# Patient Record
Sex: Female | Born: 1990 | Hispanic: No | Marital: Married | Smoking: Never smoker
Health system: Southern US, Community
[De-identification: ages and names within clinical notes are randomized; demographics above are authoritative.]

---

## 2020-10-21 NOTE — L&D Delivery Note (Addendum)
OB/GYN Faculty Practice Delivery Note  Mary Quinn is a 30 y.o. [id number] s/p Breech VD at [redacted]w[redacted]d. She was admitted for SOL.   ROM: 0h 74m with clear fluid GBS Status:  Positive/-- (04/20 0000) (received >2 doses of amp) Maximum Maternal Temperature: 97.24F  Labor Progress: Initial SVE: 1/90-100/-1. She had spontaneous ROM at 5cm around 0855 and then quickly progressed to complete.    Delivery Date/Time: 06/10/2021 at 0914 Delivery: Called to room and patient was complete. Delivery call made. Known frank breech positioning, Dr. Alysia Quinn present for delivery and delivered infant body and head without difficulty.  No nuchal cord present. Infant without spontaneous cry, placed on mother's abdomen, dried and stimulated. Cord clamped x 2 shortly after delivery and cut by provider, handed off to NICU team. Cord blood drawn. Placenta delivered spontaneously with gentle cord traction. Fundus firm with massage and Pitocin. 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  Baby Weight: pending  Placenta: Sent to pathology  Complications: None Lacerations: None  EBL: 114 mL Analgesia: None   Infant:  APGAR (1 MIN): 7 APGAR (5 MINS): 9  Mary Penna, DO  OB Family Medicine Fellow, [facility] for [employer], [facility] 06/10/2021, 9:47 AM   OB Attending Agree with above documentation. I was present and delivered infant from frank breech presentation without problems.   Nettie Elm, MD

## 2021-01-24 ENCOUNTER — Encounter: Payer: Self-pay | Admitting: General Practice

## 2021-02-07 LAB — OB RESULTS CONSOLE GBS: GBS: POSITIVE

## 2021-02-14 ENCOUNTER — Ambulatory Visit (INDEPENDENT_AMBULATORY_CARE_PROVIDER_SITE_OTHER): Payer: Medicaid Other | Admitting: Family Medicine

## 2021-02-14 ENCOUNTER — Other Ambulatory Visit (HOSPITAL_COMMUNITY)
Admission: RE | Admit: 2021-02-14 | Discharge: 2021-02-14 | Disposition: A | Discharge status: Home / Self Care | Payer: Medicaid Other | Source: Ambulatory Visit | Attending: Family Medicine | Admitting: Family Medicine

## 2021-02-14 ENCOUNTER — Other Ambulatory Visit: Payer: Self-pay

## 2021-02-14 ENCOUNTER — Encounter: Payer: Medicaid Other | Admitting: Family Medicine

## 2021-02-14 ENCOUNTER — Encounter: Payer: Self-pay | Admitting: Family Medicine

## 2021-02-14 VITALS — BP 95/57 | HR 69 | Ht 59.0 in | Wt 114.0 lb

## 2021-02-14 DIAGNOSIS — Z3A16 16 weeks gestation of pregnancy: Secondary | ICD-10-CM | POA: Diagnosis not present

## 2021-02-14 DIAGNOSIS — Z3480 Encounter for supervision of other normal pregnancy, unspecified trimester: Secondary | ICD-10-CM | POA: Insufficient documentation

## 2021-02-14 NOTE — Progress Notes (Signed)
tLanguage Resources interpreter KG.

## 2021-02-14 NOTE — Addendum Note (Signed)
Addended by: Mikey Bussing on: 02/14/2021 04:51 PM   Modules accepted: Orders

## 2021-02-14 NOTE — Progress Notes (Signed)
  Subjective:  Mary Quinn is a [id number] [redacted]w[redacted]d by LMP being seen today for her first obstetrical visit.  Her obstetrical history is significant for 2 prior vaginal deliveries. No complications. . Patient does intend to breast feed. Pregnancy history fully reviewed.  Patient reports no complaints.  BP (!) 95/57   Pulse 69   Ht 4\' 11"  (1.499 m)   Wt 114 lb (51.7 kg)   LMP 10/21/2020   BMI 23.03 kg/m   HISTORY: OB History  Gravida Para Term Preterm AB Living  3 2 2     2   SAB IAB Ectopic Multiple Live Births          2    # Outcome Date GA Lbr Len/2nd Weight Sex Delivery Anes PTL Lv  3 Current           2 Term 02/05/17 [redacted]w[redacted]d   F Vag-Spont None N LIV  1 Term 2015 [redacted]w[redacted]d   M Vag-Spont  N LIV    No past medical history on file.  History reviewed. No pertinent surgical history.  No family history on file.   Exam  BP (!) 95/57   Pulse 69   Ht 4\' 11"  (1.499 m)   Wt 114 lb (51.7 kg)   LMP 10/21/2020   BMI 23.03 kg/m   Chaperone present during exam  CONSTITUTIONAL: Well-developed, well-nourished female in no acute distress.  HENT:  Normocephalic, atraumatic, External right and left ear normal. Oropharynx is clear and moist EYES: Conjunctivae and EOM are normal. Pupils are equal, round, and reactive to light. No scleral icterus.  NECK: Normal range of motion, supple, no masses.  Normal thyroid.  CARDIOVASCULAR: Normal heart rate noted, regular rhythm RESPIRATORY: Clear to auscultation bilaterally. Effort and breath sounds normal, no problems with respiration noted. BREASTS: Symmetric in size. No masses, skin changes, nipple drainage, or lymphadenopathy. ABDOMEN: Soft, normal bowel sounds, no distention noted.  No tenderness, rebound or guarding.  PELVIC: Normal appearing external genitalia; normal appearing vaginal mucosa and cervix. No abnormal discharge noted. Normal uterine size, no other palpable masses, no uterine or adnexal tenderness. MUSCULOSKELETAL: Normal range  of motion. No tenderness.  No cyanosis, clubbing, or edema.  2+ distal pulses. SKIN: Skin is warm and dry. No rash noted. Not diaphoretic. No erythema. No pallor. NEUROLOGIC: Alert and oriented to person, place, and time. Normal reflexes, muscle tone coordination. No cranial nerve deficit noted. PSYCHIATRIC: Normal mood and affect. Normal behavior. Normal judgment and thought content.    Assessment:    Pregnancy: [redacted]w[redacted]d Patient Active Problem List   Diagnosis Date Noted  . Supervision of other normal pregnancy, antepartum 02/14/2021      Plan:   1. Supervision of other normal pregnancy, antepartum Initial labs obtained Continue prenatal vitamins Reviewed n/v relief measures and warning s/s to report Reviewed recommended weight gain based on pre-gravid BMI Encouraged well-balanced diet Genetic & carrier screening discussed: requests Panorama, Ultrasound discussed; fetal survey: requested CCNC completed> form faxed if has or is planning to apply for medicaid The nature of [facility] for 12/19/2020 with multiple MDs and other Advanced Practice Providers was explained to patient; also emphasized that fellows, residents, and students are part of our team.  2. [redacted] weeks gestation of pregnancy     Problem list reviewed and updated. 75% of 30 min visit spent on counseling and coordination of care.     [id number] 02/14/2021

## 2021-02-15 ENCOUNTER — Other Ambulatory Visit: Payer: Medicaid Other

## 2021-02-17 LAB — URINE CULTURE, OB REFLEX

## 2021-02-17 LAB — CULTURE, OB URINE

## 2021-02-19 ENCOUNTER — Other Ambulatory Visit: Payer: Medicaid Other

## 2021-02-19 ENCOUNTER — Other Ambulatory Visit: Payer: Self-pay

## 2021-02-19 ENCOUNTER — Encounter: Payer: Self-pay | Admitting: Family Medicine

## 2021-02-19 DIAGNOSIS — Z3A17 17 weeks gestation of pregnancy: Secondary | ICD-10-CM

## 2021-02-19 DIAGNOSIS — Z3480 Encounter for supervision of other normal pregnancy, unspecified trimester: Secondary | ICD-10-CM

## 2021-02-19 DIAGNOSIS — R8271 Bacteriuria: Secondary | ICD-10-CM | POA: Insufficient documentation

## 2021-02-19 LAB — CYTOLOGY - PAP
Chlamydia: NEGATIVE
Comment: NEGATIVE
Comment: NEGATIVE
Comment: NORMAL
Diagnosis: NEGATIVE
High risk HPV: NEGATIVE
Neisseria Gonorrhea: NEGATIVE

## 2021-02-19 NOTE — Progress Notes (Signed)
Pt presents for New OB labs. Pt was sent to the lab to have labs drawn.  Cayce Quezada l Quayshaun Hubbert, CMA

## 2021-02-20 LAB — CBC/D/PLT+RPR+RH+ABO+RUB AB...
Antibody Screen: NEGATIVE
Basophils Absolute: 0 10*3/uL (ref 0.0–0.2)
Basos: 0 %
EOS (ABSOLUTE): 0 10*3/uL (ref 0.0–0.4)
Eos: 1 %
HCV Ab: <0.1 s/co ratio (ref 0.0–0.9)
HIV Screen 4th Generation wRfx: NONREACTIVE
Hematocrit: 29.2 % — ABNORMAL LOW (ref 34.0–46.6)
Hemoglobin: 9.8 g/dL — ABNORMAL LOW (ref 11.1–15.9)
Hepatitis B Surface Ag: NEGATIVE
Immature Grans (Abs): 0 10*3/uL (ref 0.0–0.1)
Immature Granulocytes: 1 %
Lymphocytes Absolute: 1.1 10*3/uL (ref 0.7–3.1)
Lymphs: 19 %
MCH: 29.7 pg (ref 26.6–33.0)
MCHC: 33.6 g/dL (ref 31.5–35.7)
MCV: 89 fL (ref 79–97)
Monocytes Absolute: 0.4 10*3/uL (ref 0.1–0.9)
Monocytes: 6 %
Neutrophils Absolute: 4.3 10*3/uL (ref 1.4–7.0)
Neutrophils: 73 %
Platelets: 249 10*3/uL (ref 150–450)
RBC: 3.3 x10E6/uL — ABNORMAL LOW (ref 3.77–5.28)
RDW: 13.1 % (ref 11.7–15.4)
RPR Ser Ql: NONREACTIVE
Rh Factor: POSITIVE
Rubella Antibodies, IGG: 4.86 index (ref >0.99)
WBC: 5.8 10*3/uL (ref 3.4–10.8)

## 2021-02-20 LAB — HCV INTERPRETATION

## 2021-02-21 ENCOUNTER — Encounter: Payer: Self-pay | Admitting: Obstetrics & Gynecology

## 2021-02-21 DIAGNOSIS — O99019 Anemia complicating pregnancy, unspecified trimester: Secondary | ICD-10-CM | POA: Insufficient documentation

## 2021-02-21 LAB — HEMOGLOBPATHY+FER W/A THAL RFX
Ferritin: 19 ng/mL (ref 15–150)
Hematocrit: 30 % — ABNORMAL LOW (ref 34.0–46.6)
Hemoglobin: 9.7 g/dL — ABNORMAL LOW (ref 11.1–15.9)
Hgb A2: 2.8 % (ref 1.8–3.2)
Hgb A: 97.2 % (ref 96.4–98.8)
Hgb F: 0 % (ref 0.0–2.0)
Hgb S: 0 %
MCH: 28.4 pg (ref 26.6–33.0)
MCHC: 32.3 g/dL (ref 31.5–35.7)
MCV: 88 fL (ref 79–97)
Platelets: 258 10*3/uL (ref 150–450)
RBC: 3.42 x10E6/uL — ABNORMAL LOW (ref 3.77–5.28)
RDW: 13.1 % (ref 11.7–15.4)
WBC: 6 10*3/uL (ref 3.4–10.8)

## 2021-03-06 ENCOUNTER — Other Ambulatory Visit: Payer: Self-pay | Admitting: *Deleted

## 2021-03-06 ENCOUNTER — Ambulatory Visit: Payer: Medicaid Other | Attending: Family Medicine

## 2021-03-06 ENCOUNTER — Other Ambulatory Visit: Payer: Self-pay

## 2021-03-06 DIAGNOSIS — Z3480 Encounter for supervision of other normal pregnancy, unspecified trimester: Secondary | ICD-10-CM | POA: Insufficient documentation

## 2021-03-06 DIAGNOSIS — Z362 Encounter for other antenatal screening follow-up: Secondary | ICD-10-CM

## 2021-03-14 ENCOUNTER — Ambulatory Visit (INDEPENDENT_AMBULATORY_CARE_PROVIDER_SITE_OTHER): Payer: Medicaid Other | Admitting: Family Medicine

## 2021-03-14 ENCOUNTER — Other Ambulatory Visit: Payer: Self-pay

## 2021-03-14 ENCOUNTER — Encounter: Payer: Self-pay | Admitting: Family Medicine

## 2021-03-14 VITALS — BP 96/52 | HR 75 | Wt 119.0 lb

## 2021-03-14 DIAGNOSIS — R8271 Bacteriuria: Secondary | ICD-10-CM

## 2021-03-14 DIAGNOSIS — Z3480 Encounter for supervision of other normal pregnancy, unspecified trimester: Secondary | ICD-10-CM

## 2021-03-14 DIAGNOSIS — Z3A21 21 weeks gestation of pregnancy: Secondary | ICD-10-CM

## 2021-03-14 DIAGNOSIS — O99019 Anemia complicating pregnancy, unspecified trimester: Secondary | ICD-10-CM

## 2021-03-14 MED ORDER — PRENATAL VITAMINS 28-0.8 MG PO TABS
ORAL_TABLET | ORAL | 12 refills | Status: DC
Start: 2021-03-14 — End: 2021-05-09

## 2021-03-14 MED ORDER — FERROUS SULFATE 325 (65 FE) MG PO TBEC: 325.0000 mg | DELAYED_RELEASE_TABLET | ORAL | 2 refills | Status: AC

## 2021-03-14 NOTE — Progress Notes (Signed)
   Subjective:  Mary Quinn is a 30 y.o. G3P2002 at [redacted]w[redacted]d being seen today for ongoing prenatal care.  She is currently monitored for the following issues for this low-risk pregnancy and has Supervision of other normal pregnancy, antepartum; GBS bacteriuria; and Anemia, antepartum on their problem list.  Patient reports no complaints.  Contractions: Not present. Vag. Bleeding: None.  Movement: Present. Denies leaking of fluid.   The following portions of the patient's history were reviewed and updated as appropriate: allergies, current medications, past family history, past medical history, past social history, past surgical history and problem list. Problem list updated.  Objective:   Vitals:   03/14/21 1003  BP: (!) 96/52  Pulse: 75  Weight: 119 lb (54 kg)    Fetal Status: Fetal Heart Rate (bpm): 148   Movement: Present     General:  Alert, oriented and cooperative. Patient is in no acute distress.  Skin: Skin is warm and dry. No rash noted.   Cardiovascular: Normal heart rate noted  Respiratory: Normal respiratory effort, no problems with respiration noted  Abdomen: Soft, gravid, appropriate for gestational age. Pain/Pressure: Absent     Pelvic: Vag. Bleeding: None     Cervical exam deferred        Extremities: Normal range of motion.  Edema: None  Mental Status: Normal mood and affect. Normal behavior. Normal judgment and thought content.   Urinalysis:      Assessment and Plan:  Pregnancy: G3P2002 at [redacted]w[redacted]d  1. [redacted] weeks gestation of pregnancy  2. Supervision of other normal pregnancy, antepartum BP and FHR normal - Prenatal Vit-Fe Fumarate-FA (PRENATAL VITAMINS) 28-0.8 MG TABS; Take 1 tablet by mouth daily  Dispense: 30 tablet; Refill: 12  3. Anemia, antepartum Rx sent for every other day iron  4. GBS bacteriuria   Preterm labor symptoms and general obstetric precautions including but not limited to vaginal bleeding, contractions, leaking of fluid and fetal  movement were reviewed in detail with the patient. Please refer to After Visit Summary for other counseling recommendations.  Return in 4 weeks (on 04/11/2021) for [facility], ob visit.   Venora Maples, MD

## 2021-03-14 NOTE — Patient Instructions (Signed)
 Contraception Choices Contraception, also called birth control, refers to methods or devices that prevent pregnancy. Hormonal methods Contraceptive implant A contraceptive implant is a thin, plastic tube that contains a hormone that prevents pregnancy. It is different from an intrauterine device (IUD). It is inserted into the upper part of the arm by a health care provider. Implants can be effective for up to 3 years. Progestin-only injections Progestin-only injections are injections of progestin, a synthetic form of the hormone progesterone. They are given every 3 months by a health care provider. Birth control pills Birth control pills are pills that contain hormones that prevent pregnancy. They must be taken once a day, preferably at the same time each day. A prescription is needed to use this method of contraception. Birth control patch The birth control patch contains hormones that prevent pregnancy. It is placed on the skin and must be changed once a week for three weeks and removed on the fourth week. A prescription is needed to use this method of contraception. Vaginal ring A vaginal ring contains hormones that prevent pregnancy. It is placed in the vagina for three weeks and removed on the fourth week. After that, the process is repeated with a new ring. A prescription is needed to use this method of contraception. Emergency contraceptive Emergency contraceptives prevent pregnancy after unprotected sex. They come in pill form and can be taken up to 5 days after sex. They work best the sooner they are taken after having sex. Most emergency contraceptives are available without a prescription. This method should not be used as your only form of birth control.   Barrier methods Female condom A female condom is a thin sheath that is worn over the penis during sex. Condoms keep sperm from going inside a woman's body. They can be used with a sperm-killing substance (spermicide) to increase their  effectiveness. They should be thrown away after one use. Female condom A female condom is a soft, loose-fitting sheath that is put into the vagina before sex. The condom keeps sperm from going inside a woman's body. They should be thrown away after one use. Diaphragm A diaphragm is a soft, dome-shaped barrier. It is inserted into the vagina before sex, along with a spermicide. The diaphragm blocks sperm from entering the uterus, and the spermicide kills sperm. A diaphragm should be left in the vagina for 6-8 hours after sex and removed within 24 hours. A diaphragm is prescribed and fitted by a health care provider. A diaphragm should be replaced every 1-2 years, after giving birth, after gaining more than 15 lb (6.8 kg), and after pelvic surgery. Cervical cap A cervical cap is a round, soft latex or plastic cup that fits over the cervix. It is inserted into the vagina before sex, along with spermicide. It blocks sperm from entering the uterus. The cap should be left in place for 6-8 hours after sex and removed within 48 hours. A cervical cap must be prescribed and fitted by a health care provider. It should be replaced every 2 years. Sponge A sponge is a soft, circular piece of polyurethane foam with spermicide in it. The sponge helps block sperm from entering the uterus, and the spermicide kills sperm. To use it, you make it wet and then insert it into the vagina. It should be inserted before sex, left in for at least 6 hours after sex, and removed and thrown away within 30 hours. Spermicides Spermicides are chemicals that kill or block sperm from entering the   cervix and uterus. They can come as a cream, jelly, suppository, foam, or tablet. A spermicide should be inserted into the vagina with an applicator at least 10-15 minutes before sex to allow time for it to work. The process must be repeated every time you have sex. Spermicides do not require a prescription.   Intrauterine  contraception Intrauterine device (IUD) An IUD is a T-shaped device that is put in a woman's uterus. There are two types:  Hormone IUD.This type contains progestin, a synthetic form of the hormone progesterone. This type can stay in place for 3-5 years.  Copper IUD.This type is wrapped in copper wire. It can stay in place for 10 years. Permanent methods of contraception Female tubal ligation In this method, a woman's fallopian tubes are sealed, tied, or blocked during surgery to prevent eggs from traveling to the uterus. Hysteroscopic sterilization In this method, a small, flexible insert is placed into each fallopian tube. The inserts cause scar tissue to form in the fallopian tubes and block them, so sperm cannot reach an egg. The procedure takes about 3 months to be effective. Another form of birth control must be used during those 3 months. Female sterilization This is a procedure to tie off the tubes that carry sperm (vasectomy). After the procedure, the man can still ejaculate fluid (semen). Another form of birth control must be used for 3 months after the procedure. Natural planning methods Natural family planning In this method, a couple does not have sex on days when the woman could become pregnant. Calendar method In this method, the woman keeps track of the length of each menstrual cycle, identifies the days when pregnancy can happen, and does not have sex on those days. Ovulation method In this method, a couple avoids sex during ovulation. Symptothermal method This method involves not having sex during ovulation. The woman typically checks for ovulation by watching changes in her temperature and in the consistency of cervical mucus. Post-ovulation method In this method, a couple waits to have sex until after ovulation. Where to find more information  Centers for Disease Control and Prevention: www.cdc.gov Summary  Contraception, also called birth control, refers to methods or  devices that prevent pregnancy.  Hormonal methods of contraception include implants, injections, pills, patches, vaginal rings, and emergency contraceptives.  Barrier methods of contraception can include female condoms, female condoms, diaphragms, cervical caps, sponges, and spermicides.  There are two types of IUDs (intrauterine devices). An IUD can be put in a woman's uterus to prevent pregnancy for 3-5 years.  Permanent sterilization can be done through a procedure for males and females. Natural family planning methods involve nothaving sex on days when the woman could become pregnant. This information is not intended to replace advice given to you by your health care provider. Make sure you discuss any questions you have with your health care provider. Document Revised: 03/13/2020 Document Reviewed: 03/13/2020 Elsevier Patient Education  2021 [employer] Inc.   Breastfeeding  Choosing to breastfeed is one of the best decisions you can make for yourself and your baby. A change in hormones during pregnancy causes your breasts to make breast milk in your milk-producing glands. Hormones prevent breast milk from being released before your baby is born. They also prompt milk flow after birth. Once breastfeeding has begun, thoughts of your baby, as well as his or her sucking or crying, can stimulate the release of milk from your milk-producing glands. Benefits of breastfeeding Research shows that breastfeeding offers many health benefits   for infants and mothers. It also offers a cost-free and convenient way to feed your baby. For your baby  Your first milk (colostrum) helps your baby's digestive system to function better.  Special cells in your milk (antibodies) help your baby to fight off infections.  Breastfed babies are less likely to develop asthma, allergies, obesity, or type 2 diabetes. They are also at lower risk for sudden infant death syndrome (SIDS).  Nutrients in breast milk are better  able to meet your baby's needs compared to infant formula.  Breast milk improves your baby's brain development. For you  Breastfeeding helps to create a very special bond between you and your baby.  Breastfeeding is convenient. Breast milk costs nothing and is always available at the correct temperature.  Breastfeeding helps to burn calories. It helps you to lose the weight that you gained during pregnancy.  Breastfeeding makes your uterus return faster to its size before pregnancy. It also slows bleeding (lochia) after you give birth.  Breastfeeding helps to lower your risk of developing type 2 diabetes, osteoporosis, rheumatoid arthritis, cardiovascular disease, and breast, ovarian, uterine, and endometrial cancer later in life. Breastfeeding basics Starting breastfeeding  Find a comfortable place to sit or lie down, with your neck and back well-supported.  Place a pillow or a rolled-up blanket under your baby to bring him or her to the level of your breast (if you are seated). Nursing pillows are specially designed to help support your arms and your baby while you breastfeed.  Make sure that your baby's tummy (abdomen) is facing your abdomen.  Gently massage your breast. With your fingertips, massage from the outer edges of your breast inward toward the nipple. This encourages milk flow. If your milk flows slowly, you may need to continue this action during the feeding.  Support your breast with 4 fingers underneath and your thumb above your nipple (make the letter "C" with your hand). Make sure your fingers are well away from your nipple and your baby's mouth.  Stroke your baby's lips gently with your finger or nipple.  When your baby's mouth is open wide enough, quickly bring your baby to your breast, placing your entire nipple and as much of the areola as possible into your baby's mouth. The areola is the colored area around your nipple. ? More areola should be visible above your  baby's upper lip than below the lower lip. ? Your baby's lips should be opened and extended outward (flanged) to ensure an adequate, comfortable latch. ? Your baby's tongue should be between his or her lower gum and your breast.  Make sure that your baby's mouth is correctly positioned around your nipple (latched). Your baby's lips should create a seal on your breast and be turned out (everted).  It is common for your baby to suck about 2-3 minutes in order to start the flow of breast milk. Latching Teaching your baby how to latch onto your breast properly is very important. An improper latch can cause nipple pain, decreased milk supply, and poor weight gain in your baby. Also, if your baby is not latched onto your nipple properly, he or she may swallow some air during feeding. This can make your baby fussy. Burping your baby when you switch breasts during the feeding can help to get rid of the air. However, teaching your baby to latch on properly is still the best way to prevent fussiness from swallowing air while breastfeeding. Signs that your baby has successfully latched onto   your nipple  Silent tugging or silent sucking, without causing you pain. Infant's lips should be extended outward (flanged).  Swallowing heard between every 3-4 sucks once your milk has started to flow (after your let-down milk reflex occurs).  Muscle movement above and in front of his or her ears while sucking. Signs that your baby has not successfully latched onto your nipple  Sucking sounds or smacking sounds from your baby while breastfeeding.  Nipple pain. If you think your baby has not latched on correctly, slip your finger into the corner of your baby's mouth to break the suction and place it between your baby's gums. Attempt to start breastfeeding again. Signs of successful breastfeeding Signs from your baby  Your baby will gradually decrease the number of sucks or will completely stop sucking.  Your baby  will fall asleep.  Your baby's body will relax.  Your baby will retain a small amount of milk in his or her mouth.  Your baby will let go of your breast by himself or herself. Signs from you  Breasts that have increased in firmness, weight, and size 1-3 hours after feeding.  Breasts that are softer immediately after breastfeeding.  Increased milk volume, as well as a change in milk consistency and color by the fifth day of breastfeeding.  Nipples that are not sore, cracked, or bleeding. Signs that your baby is getting enough milk  Wetting at least 1-2 diapers during the first 24 hours after birth.  Wetting at least 5-6 diapers every 24 hours for the first week after birth. The urine should be clear or pale yellow by the age of 5 days.  Wetting 6-8 diapers every 24 hours as your baby continues to grow and develop.  At least 3 stools in a 24-hour period by the age of 5 days. The stool should be soft and yellow.  At least 3 stools in a 24-hour period by the age of 7 days. The stool should be seedy and yellow.  No loss of weight greater than 10% of birth weight during the first 3 days of life.  Average weight gain of 4-7 oz (113-198 g) per week after the age of 4 days.  Consistent daily weight gain by the age of 5 days, without weight loss after the age of 2 weeks. After a feeding, your baby may spit up a small amount of milk. This is normal. Breastfeeding frequency and duration Frequent feeding will help you make more milk and can prevent sore nipples and extremely full breasts (breast engorgement). Breastfeed when you feel the need to reduce the fullness of your breasts or when your baby shows signs of hunger. This is called "breastfeeding on demand." Signs that your baby is hungry include:  Increased alertness, activity, or restlessness.  Movement of the head from side to side.  Opening of the mouth when the corner of the mouth or cheek is stroked (rooting).  Increased  sucking sounds, smacking lips, cooing, sighing, or squeaking.  Hand-to-mouth movements and sucking on fingers or hands.  Fussing or crying. Avoid introducing a pacifier to your baby in the first 4-6 weeks after your baby is born. After this time, you may choose to use a pacifier. Research has shown that pacifier use during the first year of a baby's life decreases the risk of sudden infant death syndrome (SIDS). Allow your baby to feed on each breast as long as he or she wants. When your baby unlatches or falls asleep while feeding from the   first breast, offer the second breast. Because newborns are often sleepy in the first few weeks of life, you may need to awaken your baby to get him or her to feed. Breastfeeding times will vary from baby to baby. However, the following rules can serve as a guide to help you make sure that your baby is properly fed:  Newborns (babies 4 weeks of age or younger) may breastfeed every 1-3 hours.  Newborns should not go without breastfeeding for longer than 3 hours during the day or 5 hours during the night.  You should breastfeed your baby a minimum of 8 times in a 24-hour period. Breast milk pumping Pumping and storing breast milk allows you to make sure that your baby is exclusively fed your breast milk, even at times when you are unable to breastfeed. This is especially important if you go back to work while you are still breastfeeding, or if you are not able to be present during feedings. Your lactation consultant can help you find a method of pumping that works best for you and give you guidelines about how long it is safe to store breast milk.      Caring for your breasts while you breastfeed Nipples can become dry, cracked, and sore while breastfeeding. The following recommendations can help keep your breasts moisturized and healthy:  Avoid using soap on your nipples.  Wear a supportive bra designed especially for nursing. Avoid wearing underwire-style  bras or extremely tight bras (sports bras).  Air-dry your nipples for 3-4 minutes after each feeding.  Use only cotton bra pads to absorb leaked breast milk. Leaking of breast milk between feedings is normal.  Use lanolin on your nipples after breastfeeding. Lanolin helps to maintain your skin's normal moisture barrier. Pure lanolin is not harmful (not toxic) to your baby. You may also hand express a few drops of breast milk and gently massage that milk into your nipples and allow the milk to air-dry. In the first few weeks after giving birth, some women experience breast engorgement. Engorgement can make your breasts feel heavy, warm, and tender to the touch. Engorgement peaks within 3-5 days after you give birth. The following recommendations can help to ease engorgement:  Completely empty your breasts while breastfeeding or pumping. You may want to start by applying warm, moist heat (in the shower or with warm, water-soaked hand towels) just before feeding or pumping. This increases circulation and helps the milk flow. If your baby does not completely empty your breasts while breastfeeding, pump any extra milk after he or she is finished.  Apply ice packs to your breasts immediately after breastfeeding or pumping, unless this is too uncomfortable for you. To do this: ? Put ice in a plastic bag. ? Place a towel between your skin and the bag. ? Leave the ice on for 20 minutes, 2-3 times a day.  Make sure that your baby is latched on and positioned properly while breastfeeding. If engorgement persists after 48 hours of following these recommendations, contact your health care provider or a lactation consultant. Overall health care recommendations while breastfeeding  Eat 3 healthy meals and 3 snacks every day. Well-nourished mothers who are breastfeeding need an additional 450-500 calories a day. You can meet this requirement by increasing the amount of a balanced diet that you eat.  Drink  enough water to keep your urine pale yellow or clear.  Rest often, relax, and continue to take your prenatal vitamins to prevent fatigue, stress, and low   vitamin and mineral levels in your body (nutrient deficiencies).  Do not use any products that contain nicotine or tobacco, such as cigarettes and e-cigarettes. Your baby may be harmed by chemicals from cigarettes that pass into breast milk and exposure to secondhand smoke. If you need help quitting, ask your health care provider.  Avoid alcohol.  Do not use illegal drugs or marijuana.  Talk with your health care provider before taking any medicines. These include over-the-counter and prescription medicines as well as vitamins and herbal supplements. Some medicines that may be harmful to your baby can pass through breast milk.  It is possible to become pregnant while breastfeeding. If birth control is desired, ask your health care provider about options that will be safe while breastfeeding your baby. Where to find more information: [employer]: www.llli.org Contact a health care provider if:  You feel like you want to stop breastfeeding or have become frustrated with breastfeeding.  Your nipples are cracked or bleeding.  Your breasts are red, tender, or warm.  You have: ? Painful breasts or nipples. ? A swollen area on either breast. ? A fever or chills. ? Nausea or vomiting. ? Drainage other than breast milk from your nipples.  Your breasts do not become full before feedings by the fifth day after you give birth.  You feel sad and depressed.  Your baby is: ? Too sleepy to eat well. ? Having trouble sleeping. ? More than 1 week old and wetting fewer than 6 diapers in a 24-hour period. ? Not gaining weight by 5 days of age.  Your baby has fewer than 3 stools in a 24-hour period.  Your baby's skin or the white parts of his or her eyes become yellow. Get help right away if:  Your baby is overly tired  (lethargic) and does not want to wake up and feed.  Your baby develops an unexplained fever. Summary  Breastfeeding offers many health benefits for infant and mothers.  Try to breastfeed your infant when he or she shows early signs of hunger.  Gently tickle or stroke your baby's lips with your finger or nipple to allow the baby to open his or her mouth. Bring the baby to your breast. Make sure that much of the areola is in your baby's mouth. Offer one side and burp the baby before you offer the other side.  Talk with your health care provider or lactation consultant if you have questions or you face problems as you breastfeed. This information is not intended to replace advice given to you by your health care provider. Make sure you discuss any questions you have with your health care provider. Document Revised: 01/01/2018 Document Reviewed: 11/08/2016 Elsevier Patient Education  2021 Elsevier Inc.  

## 2021-03-14 NOTE — Progress Notes (Signed)
[employer] interpreter [place] [phone].

## 2021-04-04 ENCOUNTER — Other Ambulatory Visit: Payer: Self-pay

## 2021-04-04 ENCOUNTER — Ambulatory Visit: Payer: Medicaid Other | Attending: Obstetrics

## 2021-04-04 ENCOUNTER — Ambulatory Visit: Payer: Medicaid Other | Admitting: *Deleted

## 2021-04-04 ENCOUNTER — Encounter: Payer: Self-pay | Admitting: *Deleted

## 2021-04-04 VITALS — BP 91/57 | HR 85

## 2021-04-04 DIAGNOSIS — O321XX0 Maternal care for breech presentation, not applicable or unspecified: Secondary | ICD-10-CM

## 2021-04-04 DIAGNOSIS — Z3A24 24 weeks gestation of pregnancy: Secondary | ICD-10-CM

## 2021-04-04 DIAGNOSIS — Z363 Encounter for antenatal screening for malformations: Secondary | ICD-10-CM

## 2021-04-04 DIAGNOSIS — O99012 Anemia complicating pregnancy, second trimester: Secondary | ICD-10-CM | POA: Diagnosis not present

## 2021-04-04 DIAGNOSIS — O99019 Anemia complicating pregnancy, unspecified trimester: Secondary | ICD-10-CM | POA: Insufficient documentation

## 2021-04-04 DIAGNOSIS — Z3687 Encounter for antenatal screening for uncertain dates: Secondary | ICD-10-CM | POA: Diagnosis not present

## 2021-04-04 DIAGNOSIS — D649 Anemia, unspecified: Secondary | ICD-10-CM

## 2021-04-04 DIAGNOSIS — Z3480 Encounter for supervision of other normal pregnancy, unspecified trimester: Secondary | ICD-10-CM | POA: Diagnosis present

## 2021-04-04 DIAGNOSIS — O0932 Supervision of pregnancy with insufficient antenatal care, second trimester: Secondary | ICD-10-CM

## 2021-04-04 DIAGNOSIS — Z362 Encounter for other antenatal screening follow-up: Secondary | ICD-10-CM

## 2021-04-12 ENCOUNTER — Ambulatory Visit (INDEPENDENT_AMBULATORY_CARE_PROVIDER_SITE_OTHER): Payer: Medicaid Other | Admitting: Family Medicine

## 2021-04-12 ENCOUNTER — Other Ambulatory Visit: Payer: Self-pay

## 2021-04-12 VITALS — BP 91/50 | HR 76 | Wt 122.0 lb

## 2021-04-12 DIAGNOSIS — Z3480 Encounter for supervision of other normal pregnancy, unspecified trimester: Secondary | ICD-10-CM

## 2021-04-12 DIAGNOSIS — Z3A25 25 weeks gestation of pregnancy: Secondary | ICD-10-CM

## 2021-04-12 DIAGNOSIS — R8271 Bacteriuria: Secondary | ICD-10-CM

## 2021-04-12 DIAGNOSIS — O99019 Anemia complicating pregnancy, unspecified trimester: Secondary | ICD-10-CM

## 2021-04-12 NOTE — Progress Notes (Signed)
   PRENATAL VISIT NOTE  Subjective:  Mary Quinn is a 30 y.o. G3P2002 at [redacted]w[redacted]d being seen today for ongoing prenatal care.  She is currently monitored for the following issues for this low-risk pregnancy and has Supervision of other normal pregnancy, antepartum; GBS bacteriuria; and Anemia, antepartum on their problem list.  Patient reports no complaints.  Contractions: Not present. Vag. Bleeding: None.  Movement: Present. Denies leaking of fluid.   The following portions of the patient's history were reviewed and updated as appropriate: allergies, current medications, past family history, past medical history, past social history, past surgical history and problem list.   Objective:   Vitals:   04/12/21 1045  BP: (!) 91/50  Pulse: 76  Weight: 122 lb (55.3 kg)    Fetal Status: Fetal Heart Rate (bpm): 146   Movement: Present     General:  Alert, oriented and cooperative. Patient is in no acute distress.  Skin: Skin is warm and dry. No rash noted.   Cardiovascular: Normal heart rate noted  Respiratory: Normal respiratory effort, no problems with respiration noted  Abdomen: Soft, gravid, appropriate for gestational age.  Pain/Pressure: Absent     Pelvic: Cervical exam deferred        Extremities: Normal range of motion.  Edema: None  Mental Status: Normal mood and affect. Normal behavior. Normal judgment and thought content.   Assessment and Plan:  Pregnancy: G3P2002 at [redacted]w[redacted]d 1. [redacted] weeks gestation of pregnancy  2. Supervision of other normal pregnancy, antepartum FHT and FH normal  3. GBS bacteriuria Intrapartum Abx  4. Anemia, antepartum On oral iron  Preterm labor symptoms and general obstetric precautions including but not limited to vaginal bleeding, contractions, leaking of fluid and fetal movement were reviewed in detail with the patient. Please refer to After Visit Summary for other counseling recommendations.   No follow-ups on file.  No future  appointments.  Levie Heritage, DO

## 2021-04-12 NOTE — Progress Notes (Signed)
[facility] language services interpreter Aryam.

## 2021-05-08 ENCOUNTER — Other Ambulatory Visit: Payer: Self-pay

## 2021-05-08 ENCOUNTER — Ambulatory Visit (INDEPENDENT_AMBULATORY_CARE_PROVIDER_SITE_OTHER): Payer: Medicaid Other

## 2021-05-08 VITALS — BP 81/40 | HR 76 | Wt 123.0 lb

## 2021-05-08 DIAGNOSIS — O99019 Anemia complicating pregnancy, unspecified trimester: Secondary | ICD-10-CM

## 2021-05-08 DIAGNOSIS — Z789 Other specified health status: Secondary | ICD-10-CM | POA: Insufficient documentation

## 2021-05-08 DIAGNOSIS — Z3480 Encounter for supervision of other normal pregnancy, unspecified trimester: Secondary | ICD-10-CM

## 2021-05-08 DIAGNOSIS — Z3A29 29 weeks gestation of pregnancy: Secondary | ICD-10-CM

## 2021-05-08 NOTE — Progress Notes (Signed)
[facility] Erp interpreter [phone].

## 2021-05-08 NOTE — Progress Notes (Addendum)
   LOW-RISK PREGNANCY OFFICE VISIT  Patient name: Mary Quinn MRN [id number]  Date of birth: [birth year] Chief Complaint:   Routine Prenatal Visit  Subjective:   Mary Quinn is a 30 y.o. G92P2002 female at [redacted]w[redacted]d with an Estimated Date of Delivery: 07/22/21 being seen today for ongoing management of a low-risk pregnancy aeb has Supervision of other normal pregnancy, antepartum; GBS bacteriuria; Anemia, antepartum; and Language barrier on their problem list.  Patient presents today with no complaints.  She denies dizziness, SOB, or lightheadedness. Patient endorses fetal movement. Patient denies abdominal cramping or contractions.  Patient denies vaginal concerns including abnormal discharge, leaking of fluid, and bleeding.  Contractions: Not present. Vag. Bleeding: None.  Movement: Present.  Reviewed past medical,surgical, social, obstetrical and family history as well as problem list, medications and allergies.  Objective   Vitals:   05/08/21 0830 05/08/21 0839  BP: (!) 81/48 (!) 81/40  Pulse: 76   Weight: 123 lb (55.8 kg)   Body mass index is 24.84 kg/m.  Total Weight Gain:9 lb (4.082 kg)         Physical Examination:   General appearance: Well appearing, and in no distress  Mental status: Alert, oriented to person, place, and time  Skin: Warm & dry  Cardiovascular: Normal heart rate noted  Respiratory: Normal respiratory effort, no distress  Abdomen: Soft, gravid, nontender, AGA with Fundal Height: 29 cm  Pelvic: Cervical exam deferred           Extremities: Edema: None  Fetal Status: Fetal Heart Rate (bpm): 145  Movement: Present   No results found for this or any previous visit (from the past 24 hour(s)).  Assessment & Plan:  Low-risk pregnancy of a 30 y.o., G3P2002 at [redacted]w[redacted]d with an Estimated Date of Delivery: 07/22/21   1. Supervision of other normal pregnancy, antepartum -Anticipatory guidance for upcoming appts. -Patient to schedule next appt in 2 weeks for an  in-person visit. -Reviewed blood draw procedures and labs which also include check of iron/HgB level, RPR, and HIV *Informed that repeat RPR/HIV are for pediatric records/compliance.  -Discussed how results of GTT are handled including diabetic education and BS testing for abnormal results and routine care for normal results.   2. [redacted] weeks gestation of pregnancy -Doing well. -Hypotensive which has been an ongoing issue; asymptomatic  3. Language barrier Copywriter, advertising utilized; [name]  4. Anemia, antepartum -Taking iron pill every other day.  -Will repeat today with 28 week labs.   Meds: No orders of the defined types were placed in this encounter.  Labs/procedures today:  Lab Orders  CBC  Glucose Tolerance, 2 Hours w/1 Hour  HIV Antibody (routine testing w rflx)  RPR     Reviewed: Preterm labor symptoms and general obstetric precautions including but not limited to vaginal bleeding, contractions, leaking of fluid and fetal movement were reviewed in detail with the patient.  All questions were answered.  Follow-up: Return in about 2 weeks (around 05/22/2021) for LROB.  Orders Placed This Encounter  Procedures   CBC   Glucose Tolerance, 2 Hours w/1 Hour   HIV Antibody (routine testing w rflx)   RPR   Cherre Robins MSN, CNM 05/08/2021

## 2021-05-09 ENCOUNTER — Other Ambulatory Visit: Payer: Self-pay

## 2021-05-09 DIAGNOSIS — Z3480 Encounter for supervision of other normal pregnancy, unspecified trimester: Secondary | ICD-10-CM

## 2021-05-09 MED ORDER — PRENATAL VITAMINS 28-0.8 MG PO TABS: ORAL_TABLET | ORAL | 12 refills | Status: DC

## 2021-05-10 LAB — CBC
Hematocrit: 33.1 % — ABNORMAL LOW (ref 34.0–46.6)
Hemoglobin: 10.7 g/dL — ABNORMAL LOW (ref 11.1–15.9)
MCH: 28.2 pg (ref 26.6–33.0)
MCHC: 32.3 g/dL (ref 31.5–35.7)
MCV: 87 fL (ref 79–97)
Platelets: 234 10*3/uL (ref 150–450)
RBC: 3.79 x10E6/uL (ref 3.77–5.28)
RDW: 12.4 % (ref 11.7–15.4)
WBC: 7 10*3/uL (ref 3.4–10.8)

## 2021-05-10 LAB — GLUCOSE TOLERANCE, 2 HOURS W/ 1HR
Glucose, 1 hour: 125 mg/dL (ref 65–179)
Glucose, 2 hour: 102 mg/dL (ref 65–152)
Glucose, Fasting: 79 mg/dL (ref 65–91)

## 2021-05-10 LAB — HIV ANTIBODY (ROUTINE TESTING W REFLEX): HIV Screen 4th Generation wRfx: NONREACTIVE

## 2021-05-10 LAB — RPR: RPR Ser Ql: NONREACTIVE

## 2021-05-24 ENCOUNTER — Ambulatory Visit (INDEPENDENT_AMBULATORY_CARE_PROVIDER_SITE_OTHER): Payer: Medicaid Other | Admitting: Obstetrics and Gynecology

## 2021-05-24 ENCOUNTER — Encounter: Payer: Self-pay | Admitting: Obstetrics and Gynecology

## 2021-05-24 ENCOUNTER — Other Ambulatory Visit: Payer: Self-pay

## 2021-05-24 VITALS — BP 99/57 | HR 85 | Wt 128.0 lb

## 2021-05-24 DIAGNOSIS — R8271 Bacteriuria: Secondary | ICD-10-CM

## 2021-05-24 DIAGNOSIS — O99019 Anemia complicating pregnancy, unspecified trimester: Secondary | ICD-10-CM

## 2021-05-24 DIAGNOSIS — Z3480 Encounter for supervision of other normal pregnancy, unspecified trimester: Secondary | ICD-10-CM

## 2021-05-24 DIAGNOSIS — Z789 Other specified health status: Secondary | ICD-10-CM

## 2021-05-24 DIAGNOSIS — Z3A31 31 weeks gestation of pregnancy: Secondary | ICD-10-CM

## 2021-05-24 NOTE — Progress Notes (Signed)
   PRENATAL VISIT NOTE  Subjective:  Mary Quinn is a 30 y.o. G3P2002 at [redacted]w[redacted]d being seen today for ongoing prenatal care.  She is currently monitored for the following issues for this low-risk pregnancy and has Supervision of other normal pregnancy, antepartum; GBS bacteriuria; Anemia, antepartum; and Language barrier on their problem list.  Patient reports no complaints.  Contractions: Not present. Vag. Bleeding: None.  Movement: Present. Denies leaking of fluid.   The following portions of the patient's history were reviewed and updated as appropriate: allergies, current medications, past family history, past medical history, past social history, past surgical history and problem list.   Objective:   Vitals:   05/24/21 1006  BP: (!) 99/57  Pulse: 85  Weight: 128 lb (58.1 kg)    Fetal Status: Fetal Heart Rate (bpm): 144   Movement: Present     General:  Alert, oriented and cooperative. Patient is in no acute distress.  Skin: Skin is warm and dry. No rash noted.   Cardiovascular: Normal heart rate noted  Respiratory: Normal respiratory effort, no problems with respiration noted  Abdomen: Soft, gravid, appropriate for gestational age.  Pain/Pressure: Absent     Pelvic: Cervical exam deferred        Extremities: Normal range of motion.  Edema: None  Mental Status: Normal mood and affect. Normal behavior. Normal judgment and thought content.   Assessment and Plan:  Pregnancy: G3P2002 at [redacted]w[redacted]d  1. [redacted] weeks gestation of pregnancy  2. Language barrier Trigriniean interpretor used  3. Anemia, antepartum Taking iron  4. Supervision of other normal pregnancy, antepartum  5. GBS bacteriuria Ppx in labor  Preterm labor symptoms and general obstetric precautions including but not limited to vaginal bleeding, contractions, leaking of fluid and fetal movement were reviewed in detail with the patient. Please refer to After Visit Summary for other counseling recommendations.    Return in about 2 weeks (around 06/07/2021) for low OB, in person.  Future Appointments  Date Time Provider Department Center  06/07/2021  9:55 AM Levie Heritage, DO [facility] None  06/21/2021  9:55 AM Levie Heritage, DO [facility] None  06/28/2021  9:15 AM Levie Heritage, DO [facility] None  07/06/2021  9:55 AM Venora Maples, MD [facility] None  07/12/2021  9:55 AM Levie Heritage, DO [facility] None  07/19/2021  9:55 AM Levie Heritage, DO [facility] None    Conan Bowens, MD'

## 2021-05-24 NOTE — Progress Notes (Signed)
[employer] language services interpreter Raford Pitcher [phone].

## 2021-06-07 ENCOUNTER — Ambulatory Visit (INDEPENDENT_AMBULATORY_CARE_PROVIDER_SITE_OTHER): Payer: Medicaid Other | Admitting: Family Medicine

## 2021-06-07 ENCOUNTER — Other Ambulatory Visit: Payer: Self-pay

## 2021-06-07 VITALS — BP 100/54 | HR 75 | Wt 132.0 lb

## 2021-06-07 DIAGNOSIS — Z3A33 33 weeks gestation of pregnancy: Secondary | ICD-10-CM

## 2021-06-07 DIAGNOSIS — Z789 Other specified health status: Secondary | ICD-10-CM

## 2021-06-07 DIAGNOSIS — R8271 Bacteriuria: Secondary | ICD-10-CM

## 2021-06-07 DIAGNOSIS — Z3480 Encounter for supervision of other normal pregnancy, unspecified trimester: Secondary | ICD-10-CM

## 2021-06-07 NOTE — Progress Notes (Signed)
   PRENATAL VISIT NOTE  Subjective:  Mary Quinn is a 30 y.o. G3P2002 at [redacted]w[redacted]d being seen today for ongoing prenatal care.  She is currently monitored for the following issues for this low-risk pregnancy and has Supervision of other normal pregnancy, antepartum; GBS bacteriuria; Anemia, antepartum; and Language barrier on their problem list.  Patient reports no complaints.  Contractions: Not present. Vag. Bleeding: None.  Movement: Present. Denies leaking of fluid.   The following portions of the patient's history were reviewed and updated as appropriate: allergies, current medications, past family history, past medical history, past social history, past surgical history and problem list.   Objective:   Vitals:   06/07/21 1004  BP: (!) 100/54  Pulse: 75  Weight: 132 lb (59.9 kg)    Fetal Status: Fetal Heart Rate (bpm): 142 Fundal Height: 33 cm Movement: Present  Presentation: Vertex  General:  Alert, oriented and cooperative. Patient is in no acute distress.  Skin: Skin is warm and dry. No rash noted.   Cardiovascular: Normal heart rate noted  Respiratory: Normal respiratory effort, no problems with respiration noted  Abdomen: Soft, gravid, appropriate for gestational age.  Pain/Pressure: Absent     Pelvic: Cervical exam deferred        Extremities: Normal range of motion.  Edema: None  Mental Status: Normal mood and affect. Normal behavior. Normal judgment and thought content.   Assessment and Plan:  Pregnancy: G3P2002 at [redacted]w[redacted]d 1. [redacted] weeks gestation of pregnancy  2. Supervision of other normal pregnancy, antepartum FHT and FH normal  3. Language barrier Interpreter used  4. GBS bacteriuria Intrapartum PPX   Preterm labor symptoms and general obstetric precautions including but not limited to vaginal bleeding, contractions, leaking of fluid and fetal movement were reviewed in detail with the patient. Please refer to After Visit Summary for other counseling  recommendations.   No follow-ups on file.  Future Appointments  Date Time Provider Department Center  06/21/2021  9:55 AM Levie Heritage, DO [facility] None  06/28/2021  9:15 AM Levie Heritage, DO [facility] None  07/06/2021  9:55 AM Venora Maples, MD [facility] None  07/12/2021  9:55 AM Levie Heritage, DO [facility] None  07/19/2021  9:55 AM Levie Heritage, DO [facility] None    Levie Heritage, DO

## 2021-06-07 NOTE — Progress Notes (Signed)
[employer] language services interpreter Asmeret [phone].

## 2021-06-09 ENCOUNTER — Other Ambulatory Visit: Payer: Self-pay

## 2021-06-09 ENCOUNTER — Inpatient Hospital Stay (HOSPITAL_COMMUNITY)
Admission: EM | Admit: 2021-06-09 | Discharge: 2021-06-12 | DRG: 807 | Disposition: A | Discharge status: Home / Self Care | Payer: Medicaid Other | Attending: Obstetrics and Gynecology | Admitting: Obstetrics and Gynecology

## 2021-06-09 DIAGNOSIS — Z3A33 33 weeks gestation of pregnancy: Principal | ICD-10-CM

## 2021-06-09 DIAGNOSIS — Z3A34 34 weeks gestation of pregnancy: Secondary | ICD-10-CM | POA: Diagnosis not present

## 2021-06-09 DIAGNOSIS — Z603 Acculturation difficulty: Secondary | ICD-10-CM | POA: Diagnosis present

## 2021-06-09 DIAGNOSIS — O3463 Maternal care for abnormality of vagina, third trimester: Secondary | ICD-10-CM | POA: Diagnosis present

## 2021-06-09 DIAGNOSIS — O9902 Anemia complicating childbirth: Secondary | ICD-10-CM | POA: Diagnosis present

## 2021-06-09 DIAGNOSIS — Z20822 Contact with and (suspected) exposure to covid-19: Secondary | ICD-10-CM | POA: Diagnosis present

## 2021-06-09 DIAGNOSIS — Z789 Other specified health status: Secondary | ICD-10-CM | POA: Diagnosis present

## 2021-06-09 DIAGNOSIS — O99824 Streptococcus B carrier state complicating childbirth: Secondary | ICD-10-CM | POA: Diagnosis present

## 2021-06-09 DIAGNOSIS — Z3480 Encounter for supervision of other normal pregnancy, unspecified trimester: Secondary | ICD-10-CM

## 2021-06-09 DIAGNOSIS — O321XX0 Maternal care for breech presentation, not applicable or unspecified: Secondary | ICD-10-CM | POA: Diagnosis present

## 2021-06-09 DIAGNOSIS — R8271 Bacteriuria: Secondary | ICD-10-CM | POA: Diagnosis present

## 2021-06-09 DIAGNOSIS — O0993 Supervision of high risk pregnancy, unspecified, third trimester: Secondary | ICD-10-CM

## 2021-06-09 DIAGNOSIS — O99019 Anemia complicating pregnancy, unspecified trimester: Secondary | ICD-10-CM | POA: Diagnosis present

## 2021-06-09 LAB — CBC WITH DIFFERENTIAL/PLATELET
Abs Immature Granulocytes: 0.06 10*3/uL (ref 0.00–0.07)
Basophils Absolute: 0 10*3/uL (ref 0.0–0.1)
Basophils Relative: 0 %
Eosinophils Absolute: 0 10*3/uL (ref 0.0–0.5)
Eosinophils Relative: 0 %
HCT: 35.1 % — ABNORMAL LOW (ref 36.0–46.0)
Hemoglobin: 11.4 g/dL — ABNORMAL LOW (ref 12.0–15.0)
Immature Granulocytes: 0 %
Lymphocytes Relative: 11 %
Lymphs Abs: 1.5 10*3/uL (ref 0.7–4.0)
MCH: 28.7 pg (ref 26.0–34.0)
MCHC: 32.5 g/dL (ref 30.0–36.0)
MCV: 88.4 fL (ref 80.0–100.0)
Monocytes Absolute: 1 10*3/uL (ref 0.1–1.0)
Monocytes Relative: 7 %
Neutro Abs: 11.4 10*3/uL — ABNORMAL HIGH (ref 1.7–7.7)
Neutrophils Relative %: 82 %
Platelets: 196 10*3/uL (ref 150–400)
RBC: 3.97 MIL/uL (ref 3.87–5.11)
RDW: 13 % (ref 11.5–15.5)
WBC: 14 10*3/uL — ABNORMAL HIGH (ref 4.0–10.5)
nRBC: 0 % (ref 0.0–0.2)

## 2021-06-09 LAB — BASIC METABOLIC PANEL
Anion gap: 10 (ref 5–15)
BUN: <5 mg/dL — ABNORMAL LOW (ref 6–20)
CO2: 19 mmol/L — ABNORMAL LOW (ref 22–32)
Calcium: 8.6 mg/dL — ABNORMAL LOW (ref 8.9–10.3)
Chloride: 103 mmol/L (ref 98–111)
Creatinine, Ser: 0.35 mg/dL — ABNORMAL LOW (ref 0.44–1.00)
GFR, Estimated: >60 mL/min (ref >60)
Glucose, Bld: 93 mg/dL (ref 70–99)
Potassium: 3.4 mmol/L — ABNORMAL LOW (ref 3.5–5.1)
Sodium: 132 mmol/L — ABNORMAL LOW (ref 135–145)

## 2021-06-09 LAB — RESP PANEL BY RT-PCR (FLU A&B, COVID) ARPGX2
Influenza A by PCR: NEGATIVE
Influenza B by PCR: NEGATIVE
SARS Coronavirus 2 by RT PCR: NEGATIVE

## 2021-06-09 MED ORDER — ACETAMINOPHEN 325 MG PO TABS: 650.0000 mg | ORAL_TABLET | ORAL | Status: DC | PRN

## 2021-06-09 MED ORDER — LACTATED RINGERS IV SOLN: INTRAVENOUS | Status: DC

## 2021-06-09 MED ORDER — SODIUM CHLORIDE 0.9 % IV SOLN
2.0000 g | Freq: Once | INTRAVENOUS | Status: AC
  Administered 2021-06-09: 2 g via INTRAVENOUS
  Filled 2021-06-09: qty 2000

## 2021-06-09 MED ORDER — LACTATED RINGERS IV SOLN: 500.0000 mL | INTRAVENOUS | Status: DC | PRN

## 2021-06-09 MED ORDER — OXYTOCIN BOLUS FROM INFUSION: 333.0000 mL | Freq: Once | INTRAVENOUS | Status: DC

## 2021-06-09 MED ORDER — NIFEDIPINE 10 MG PO CAPS: 10.0000 mg | ORAL_CAPSULE | Freq: Once | ORAL | Status: DC

## 2021-06-09 MED ORDER — NIFEDIPINE 10 MG PO CAPS
30.0000 mg | ORAL_CAPSULE | Freq: Once | ORAL | Status: AC
  Administered 2021-06-09: 30 mg via ORAL
  Filled 2021-06-09: qty 3

## 2021-06-09 MED ORDER — MAGNESIUM SULFATE BOLUS VIA INFUSION
4.0000 g | Freq: Once | INTRAVENOUS | Status: AC
  Administered 2021-06-09: 4 g via INTRAVENOUS
  Filled 2021-06-09: qty 1000

## 2021-06-09 MED ORDER — OXYCODONE-ACETAMINOPHEN 5-325 MG PO TABS
2.0000 | ORAL_TABLET | ORAL | Status: DC | PRN
Start: 2021-06-09 — End: 2021-06-10

## 2021-06-09 MED ORDER — TERBUTALINE SULFATE 1 MG/ML IJ SOLN
0.2500 mg | Freq: Once | INTRAMUSCULAR | Status: AC
  Administered 2021-06-09: 0.25 mg via SUBCUTANEOUS

## 2021-06-09 MED ORDER — LIDOCAINE HCL (PF) 1 % IJ SOLN: 30.0000 mL | INTRAMUSCULAR | Status: DC | PRN

## 2021-06-09 MED ORDER — MAGNESIUM SULFATE 40 GM/1000ML IV SOLN
2.0000 g/h | INTRAVENOUS | Status: DC
  Filled 2021-06-09: qty 1000

## 2021-06-09 MED ORDER — SODIUM CHLORIDE 0.9 % IV SOLN
1.0000 g | INTRAVENOUS | Status: DC
  Administered 2021-06-10 (×2): 1 g via INTRAVENOUS
  Filled 2021-06-09 (×5): qty 1000

## 2021-06-09 MED ORDER — TERBUTALINE SULFATE 1 MG/ML IJ SOLN
INTRAMUSCULAR | Status: AC
  Filled 2021-06-09: qty 1

## 2021-06-09 MED ORDER — OXYCODONE-ACETAMINOPHEN 5-325 MG PO TABS: 1.0000 | ORAL_TABLET | ORAL | Status: DC | PRN

## 2021-06-09 MED ORDER — NIFEDIPINE 10 MG PO CAPS
10.0000 mg | ORAL_CAPSULE | Freq: Four times a day (QID) | ORAL | Status: DC
  Filled 2021-06-09: qty 1

## 2021-06-09 MED ORDER — ONDANSETRON HCL 4 MG/2ML IJ SOLN: 4.0000 mg | Freq: Four times a day (QID) | INTRAMUSCULAR | Status: DC | PRN

## 2021-06-09 MED ORDER — SOD CITRATE-CITRIC ACID 500-334 MG/5ML PO SOLN: 30.0000 mL | ORAL | Status: DC | PRN

## 2021-06-09 MED ORDER — BETAMETHASONE SOD PHOS & ACET 6 (3-3) MG/ML IJ SUSP
12.0000 mg | INTRAMUSCULAR | Status: DC
  Administered 2021-06-09: 12 mg via INTRAMUSCULAR
  Filled 2021-06-09: qty 5

## 2021-06-09 MED ORDER — OXYTOCIN-SODIUM CHLORIDE 30-0.9 UT/500ML-% IV SOLN
2.5000 [IU]/h | INTRAVENOUS | Status: DC
  Filled 2021-06-09: qty 500

## 2021-06-09 NOTE — H&P (Signed)
OBSTETRIC ADMISSION HISTORY AND PHYSICAL  Mary Quinn is a 30 y.o. female G3P2002 with IUP at [redacted]w[redacted]d by LMP presenting for preterm painful contractions and cervical dilation of 3 cm per nursing. Nursing was concerned that pt was actively laboring, so she was brought directly to labor and delivery from the ER. Spouse notes contractions started at 1800 and were q 10 minutes x 1 hour then increased  to q 2-3 minutes after that.  She reports +FMs, No LOF, no VB, no blurry vision, headaches or peripheral edema, and RUQ pain.  She plans on breast feeding. Prenatal care has been uncomplicated.  There is history of SVD x 2 at term.    Birth control needs to be confirmed She received her prenatal care at [facility]   Contractions started two hours ago. Was painful at home. No pain in between contractions.  Dating: By LMP --->  Estimated Date of Delivery: 07/22/21  Sono:   @[redacted]w[redacted]d , CWD, normal anatomy, breech presentation, placenta right lateral lie, 722g, 52% EFW   Prenatal History/Complications:  Late presentation to prenatal care Limited prenatal care GBS bacteuria Anemia in pregnancy  Past Medical History: No past medical history on file.  Past Surgical History: No past surgical history on file.  Obstetrical History: OB History     Gravida  3   Para  2   Term  2   Preterm      AB      Living  2      SAB      IAB      Ectopic      Multiple      Live Births  2           Social History Social History   Socioeconomic History   Marital status: Married    Spouse name: Not on file   Number of children: Not on file   Years of education: Not on file   Highest education level: Not on file  Occupational History   Not on file  Tobacco Use   Smoking status: Never   Smokeless tobacco: Never  Vaping Use   Vaping Use: Never used  Substance and Sexual Activity   Alcohol use: Never   Drug use: Never   Sexual activity: Yes    Birth control/protection: None  Other Topics  Concern   Not on file  Social History Narrative   Not on file   Social Determinants of Health   Financial Resource Strain: Not on file  Food Insecurity: Not on file  Transportation Needs: Not on file  Physical Activity: Not on file  Stress: Not on file  Social Connections: Not on file    Family History: No family history on file.  Allergies: No Known Allergies  Medications Prior to Admission  Medication Sig Dispense Refill Last Dose   ferrous sulfate 325 (65 FE) MG EC tablet Take 1 tablet (325 mg total) by mouth every other day. 30 tablet 2    Prenatal Vit-Fe Fumarate-FA (PRENATAL VITAMINS) 28-0.8 MG TABS Take 1 tablet by mouth daily 30 tablet 12      Review of Systems   All systems reviewed and negative except as stated in HPI  Blood pressure 112/61, pulse 99, temperature 98.2 F (36.8 C), temperature source Oral, resp. rate 20, last menstrual period 10/15/2020, SpO2 100 %. General appearance: alert, cooperative, and mild distress Lungs: clear to auscultation bilaterally Heart: regular rate and rhythm Abdomen: soft, non-tender; bowel sounds normal Pelvic: see nursing exam  Extremities: Homans sign is negative, no sign of DVT DTR's n/a Presentation: breech Fetal monitoringBaseline: 150 bpm, Variability: Good {> 6 bpm), Accelerations: accels present, and Decelerations: Variable Uterine activity regular Dilation: 3.5 Effacement (%): 50 Station: -3 Exam by:: Cyprus McHenry RN   Prenatal labs: ABO, Rh: --/--/O POS Performed at [facility], [address] [place], [place] [address]  [phone] 2041) Antibody: NEG (08/20 2033) Rubella: 4.86 (05/02 1027) RPR: Non Reactive (07/19 0910)  HBsAg: Negative (05/02 1027)  HIV: Non Reactive (07/19 0910)  GBS:   Bacteuria 1 hr Glucola Negative Genetic screening  Declined Anatomy US: normal although limited   Prenatal Transfer Tool  Maternal Diabetes: No Genetic Screening: Declined Maternal Ultrasounds/Referrals:  Normal Fetal Ultrasounds or other Referrals:  None Maternal Substance Abuse:  No Significant Maternal Medications:  None Significant Maternal Lab Results: Other: GBS bacteuria  Results for orders placed or performed during the hospital encounter of 06/09/21 (from the past 24 hour(s))  Type and screen [facility]   Collection Time: 06/09/21  8:33 PM  Result Value Ref Range   ABO/RH(D) O POS    Antibody Screen NEG    Sample Expiration      06/12/2021,2359 Performed at [facility], [address], [place], [place] [address]   ABO/Rh   Collection Time: 06/09/21  8:41 PM  Result Value Ref Range   ABO/RH(D)      O POS Performed at [facility], [address] [place], [place] [address]   CBC with Differential   Collection Time: 06/09/21  9:07 PM  Result Value Ref Range   WBC 14.0 (H) 4.0 - 10.5 K/uL   RBC 3.97 3.87 - 5.11 MIL/uL   Hemoglobin 11.4 (L) 12.0 - 15.0 g/dL   HCT 99.3 (L) 71.6 - 96.7 %   MCV 88.4 80.0 - 100.0 fL   MCH 28.7 26.0 - 34.0 pg   MCHC 32.5 30.0 - 36.0 g/dL   RDW 89.3 81.0 - 17.5 %   Platelets 196 150 - 400 K/uL   nRBC 0.0 0.0 - 0.2 %   Neutrophils Relative % 82 %   Neutro Abs 11.4 (H) 1.7 - 7.7 K/uL   Lymphocytes Relative 11 %   Lymphs Abs 1.5 0.7 - 4.0 K/uL   Monocytes Relative 7 %   Monocytes Absolute 1.0 0.1 - 1.0 K/uL   Eosinophils Relative 0 %   Eosinophils Absolute 0.0 0.0 - 0.5 K/uL   Basophils Relative 0 %   Basophils Absolute 0.0 0.0 - 0.1 K/uL   Immature Granulocytes 0 %   Abs Immature Granulocytes 0.06 0.00 - 0.07 K/uL    Patient Active Problem List   Diagnosis Date Noted   Encounter for supervision of high risk pregnancy in third trimester, antepartum 06/09/2021   Language barrier 05/08/2021   Anemia, antepartum 02/21/2021   GBS bacteriuria 02/19/2021   Supervision of other normal pregnancy, antepartum 02/14/2021    Assessment/Plan:  Cabrini Ruggieri is a 30 y.o. G3P2002 at [redacted]w[redacted]d here for preterm  labor  Preterm contractions  BMZ course initiated.  Pt has received terbutaline x 1 and now has received loading dose of oral procardia.  Pt seems to be responding to tocolytics.  Will get vaginal swab and urine culture.  2. Breech presentation  Pt is breech on bedside ultrasound.  If pt continues to have cervical change, I have contacted Dr. Austin Miles who may be available for vaginal breech delivery.  3.  GBS bacteuria Ampicillin ordered for GBS prophylaxis.  Will reassess cervix in 2 hours for any cervical change.  If pt remains stable she may be sent to [facility] specialty care.  Warden Fillers, MD  06/09/2021, 9:46 PM

## 2021-06-09 NOTE — Progress Notes (Signed)
[facility] RN arrived to Main ED at 2030. Pt breathing and moaning through contractions.  Pt is [redacted]w[redacted]d pregnant and reports no complications with this pregnancy. No leaking of fluid, no vag bleeding, reports positive fetal movement.  Monitors applied, fetal heart rate in the 150's, no decels.  Pt contracting every 2-3 minutes, reports contractions started about 2 hours before arriving to the ED and quickly become closer together.  Cervical exam performed, 3,50, -3 Pt vocalizing through contractions.  Dr.Bass contacted, orders to transport to MAU. While en route to MAU pt is becoming visibly and audibly more uncomfortable, is gripping the wheelchair. FOB inform [facility] RN that last baby came very quickly. OBRR RN called Dr.Bass back, decided to go straight to L&D.

## 2021-06-09 NOTE — ED Notes (Signed)
Pt taken to MAU by Janyth Pupa [facility]

## 2021-06-09 NOTE — Progress Notes (Signed)
[facility] RN received call from Main ED at 2025 about pt present with contractions. [facility] RN en route.

## 2021-06-09 NOTE — ED Provider Notes (Signed)
[facility] EMERGENCY DEPARTMENT Provider Note   CSN: [id number] Arrival date & time: 06/09/21  2022     History Chief Complaint  Patient presents with   Laboring    Mary Quinn is a 30 y.o. female.  30 year old female with prior medical history as detailed below presents with complaint of contractions.  Patient reports onset of contractions 2 hours prior.  This is her third pregnancy.  She is at 33 weeks and 6 days.  She denies any significant complications or problems with this pregnancy.  She denies vaginal bleeding or leakage of fluids.  The history is provided by the patient and a relative. A language interpreter was used.  Illness Location:  Contractions Severity:  Moderate Onset quality:  Sudden Duration:  2 hours Timing:  Constant Progression:  Unchanged Chronicity:  New     No past medical history on file.  Patient Active Problem List   Diagnosis Date Noted   Language barrier 05/08/2021   Anemia, antepartum 02/21/2021   GBS bacteriuria 02/19/2021   Supervision of other normal pregnancy, antepartum 02/14/2021    No past surgical history on file.   OB History     Gravida  3   Para  2   Term  2   Preterm      AB      Living  2      SAB      IAB      Ectopic      Multiple      Live Births  2           No family history on file.  Social History   Tobacco Use   Smoking status: Never   Smokeless tobacco: Never  Vaping Use   Vaping Use: Never used  Substance Use Topics   Alcohol use: Never   Drug use: Never    Home Medications Prior to Admission medications   Medication Sig Start Date End Date Taking? Authorizing Provider  ferrous sulfate 325 (65 FE) MG EC tablet Take 1 tablet (325 mg total) by mouth every other day. 03/14/21 09/10/21  Venora Maples, MD  Prenatal Vit-Fe Fumarate-FA (PRENATAL VITAMINS) 28-0.8 MG TABS Take 1 tablet by mouth daily 05/09/21   Anyanwu, Jethro Bastos, MD    Allergies     Patient has no known allergies.  Review of Systems   Review of Systems  All other systems reviewed and are negative.  Physical Exam Updated Vital Signs BP 112/61 (BP Location: Right Arm)   Pulse 99   Temp 98.2 F (36.8 C) (Oral)   Resp 20   LMP 10/15/2020   SpO2 100%   Physical Exam Vitals and nursing note reviewed.  Constitutional:      General: She is not in acute distress.    Appearance: Normal appearance. She is well-developed.  HENT:     Head: Normocephalic and atraumatic.  Eyes:     Conjunctiva/sclera: Conjunctivae normal.     Pupils: Pupils are equal, round, and reactive to light.  Cardiovascular:     Rate and Rhythm: Normal rate and regular rhythm.     Heart sounds: Normal heart sounds.  Pulmonary:     Effort: Pulmonary effort is normal. No respiratory distress.     Breath sounds: Normal breath sounds.  Abdominal:     General: There is no distension.     Palpations: Abdomen is soft.     Tenderness: There is no abdominal tenderness.  Comments: Gravid at 34 weeks  Musculoskeletal:        General: No deformity. Normal range of motion.     Cervical back: Normal range of motion and neck supple.  Skin:    General: Skin is warm and dry.  Neurological:     General: No focal deficit present.     Mental Status: She is alert and oriented to person, place, and time.    ED Results / Procedures / Treatments   Labs (all labs ordered are listed, but only abnormal results are displayed) Labs Reviewed  RESP PANEL BY RT-PCR (FLU A&B, COVID) [id number]  CBC WITH DIFFERENTIAL/PLATELET  BASIC METABOLIC PANEL  TYPE AND SCREEN  ABO/RH    EKG None  Radiology No results found.  Procedures Procedures   Medications Ordered in ED Medications - No data to display  ED Course  I have reviewed the triage vital signs and the nursing notes.  Pertinent labs & imaging results that were available during my care of the patient were reviewed by me and considered in my  medical decision making (see chart for details).    MDM Rules/Calculators/A&P                           MDM  MSE complete  Mary Quinn was evaluated in Emergency Department on 06/09/2021 for the symptoms described in the history of present illness. She was evaluated in the context of the global COVID-19 pandemic, which necessitated consideration that the patient might be at risk for infection with the SARS-CoV-2 virus that causes COVID-19. Institutional protocols and algorithms that pertain to the evaluation of patients at risk for COVID-19 are in a state of rapid change based on information released by regulatory bodies including the CDC and federal and state organizations. These policies and algorithms were followed during the patient's care in the ED.  Patient is presenting with complaint of contractions.  Patient is at approximately [redacted] weeks pregnant.  No reported loss of fluids or bleeding.  [facility] contacted.  Cervical check is approximately 3 cm per rapid OB.  Patient will be transported to MAU for further eval and work-up.   Final Clinical Impression(s) / ED Diagnoses Final diagnoses:  [redacted] weeks gestation of pregnancy    Rx / DC Orders ED Discharge Orders     None        Wynetta Fines, MD 06/09/21 2108

## 2021-06-10 ENCOUNTER — Encounter (HOSPITAL_COMMUNITY): Payer: Self-pay | Admitting: Obstetrics and Gynecology

## 2021-06-10 DIAGNOSIS — O99824 Streptococcus B carrier state complicating childbirth: Secondary | ICD-10-CM

## 2021-06-10 DIAGNOSIS — Z3A34 34 weeks gestation of pregnancy: Secondary | ICD-10-CM

## 2021-06-10 LAB — TYPE AND SCREEN
ABO/RH(D): O POS
Antibody Screen: NEGATIVE

## 2021-06-10 LAB — ABO/RH: ABO/RH(D): O POS

## 2021-06-10 LAB — RPR: RPR Ser Ql: NONREACTIVE

## 2021-06-10 MED ORDER — OXYTOCIN BOLUS FROM INFUSION
333.0000 mL | Freq: Once | INTRAVENOUS | Status: AC
  Administered 2021-06-10: 333 mL via INTRAVENOUS

## 2021-06-10 MED ORDER — TERBUTALINE SULFATE 1 MG/ML IJ SOLN
INTRAMUSCULAR | Status: AC
  Filled 2021-06-10: qty 1

## 2021-06-10 MED ORDER — DIBUCAINE (PERIANAL) 1 % EX OINT: 1.0000 "application " | TOPICAL_OINTMENT | CUTANEOUS | Status: DC | PRN

## 2021-06-10 MED ORDER — ACETAMINOPHEN 325 MG PO TABS: 650.0000 mg | ORAL_TABLET | ORAL | Status: DC | PRN

## 2021-06-10 MED ORDER — SODIUM CHLORIDE 0.9 % IV BOLUS
500.0000 mL | Freq: Once | INTRAVENOUS | Status: AC
  Administered 2021-06-10: 500 mL via INTRAVENOUS

## 2021-06-10 MED ORDER — FENTANYL CITRATE (PF) 100 MCG/2ML IJ SOLN
50.0000 ug | INTRAMUSCULAR | Status: DC | PRN
  Administered 2021-06-10 (×4): 100 ug via INTRAVENOUS
  Filled 2021-06-10 (×4): qty 2

## 2021-06-10 MED ORDER — SODIUM CHLORIDE 0.9% FLUSH: 3.0000 mL | INTRAVENOUS | Status: DC | PRN

## 2021-06-10 MED ORDER — DIPHENHYDRAMINE HCL 25 MG PO CAPS: 25.0000 mg | ORAL_CAPSULE | Freq: Four times a day (QID) | ORAL | Status: DC | PRN

## 2021-06-10 MED ORDER — WITCH HAZEL-GLYCERIN EX PADS: 1.0000 "application " | MEDICATED_PAD | CUTANEOUS | Status: DC | PRN

## 2021-06-10 MED ORDER — COCONUT OIL OIL: 1.0000 "application " | TOPICAL_OIL | Status: DC | PRN

## 2021-06-10 MED ORDER — LACTATED RINGERS IV SOLN: 500.0000 mL | INTRAVENOUS | Status: DC | PRN

## 2021-06-10 MED ORDER — SENNOSIDES-DOCUSATE SODIUM 8.6-50 MG PO TABS
2.0000 | ORAL_TABLET | ORAL | Status: DC
  Administered 2021-06-10 – 2021-06-11 (×2): 2 via ORAL
  Filled 2021-06-10 (×2): qty 2

## 2021-06-10 MED ORDER — SODIUM CHLORIDE 0.9 % IV SOLN: 250.0000 mL | INTRAVENOUS | Status: DC | PRN

## 2021-06-10 MED ORDER — SIMETHICONE 80 MG PO CHEW: 80.0000 mg | CHEWABLE_TABLET | ORAL | Status: DC | PRN

## 2021-06-10 MED ORDER — ONDANSETRON HCL 4 MG/2ML IJ SOLN: 4.0000 mg | INTRAMUSCULAR | Status: DC | PRN

## 2021-06-10 MED ORDER — MEDROXYPROGESTERONE ACETATE 150 MG/ML IM SUSP: 150.0000 mg | INTRAMUSCULAR | Status: DC | PRN

## 2021-06-10 MED ORDER — LACTATED RINGERS IV SOLN: INTRAVENOUS | Status: DC

## 2021-06-10 MED ORDER — MEASLES, MUMPS & RUBELLA VAC IJ SOLR: 0.5000 mL | Freq: Once | INTRAMUSCULAR | Status: DC

## 2021-06-10 MED ORDER — IBUPROFEN 600 MG PO TABS
600.0000 mg | ORAL_TABLET | Freq: Four times a day (QID) | ORAL | Status: DC
  Administered 2021-06-10 – 2021-06-12 (×7): 600 mg via ORAL
  Filled 2021-06-10 (×7): qty 1

## 2021-06-10 MED ORDER — OXYCODONE-ACETAMINOPHEN 5-325 MG PO TABS: 1.0000 | ORAL_TABLET | ORAL | Status: DC | PRN

## 2021-06-10 MED ORDER — PRENATAL MULTIVITAMIN CH
1.0000 | ORAL_TABLET | Freq: Every day | ORAL | Status: DC
  Administered 2021-06-10 – 2021-06-11 (×2): 1 via ORAL
  Filled 2021-06-10 (×2): qty 1

## 2021-06-10 MED ORDER — ONDANSETRON HCL 4 MG PO TABS: 4.0000 mg | ORAL_TABLET | ORAL | Status: DC | PRN

## 2021-06-10 MED ORDER — TETANUS-DIPHTH-ACELL PERTUSSIS 5-2.5-18.5 LF-MCG/0.5 IM SUSY
0.5000 mL | PREFILLED_SYRINGE | Freq: Once | INTRAMUSCULAR | Status: DC
Start: 2021-06-11 — End: 2021-06-12

## 2021-06-10 MED ORDER — BENZOCAINE-MENTHOL 20-0.5 % EX AERO
1.0000 "application " | INHALATION_SPRAY | CUTANEOUS | Status: DC | PRN
  Administered 2021-06-10: 1 via TOPICAL
  Filled 2021-06-10: qty 56

## 2021-06-10 MED ORDER — TERBUTALINE SULFATE 1 MG/ML IJ SOLN
0.2500 mg | Freq: Once | INTRAMUSCULAR | Status: AC
  Administered 2021-06-10: 0.25 mg via SUBCUTANEOUS
  Filled 2021-06-10: qty 1

## 2021-06-10 MED ORDER — ZOLPIDEM TARTRATE 5 MG PO TABS: 5.0000 mg | ORAL_TABLET | Freq: Every evening | ORAL | Status: DC | PRN

## 2021-06-10 MED ORDER — SODIUM CHLORIDE 0.9% FLUSH
3.0000 mL | Freq: Two times a day (BID) | INTRAVENOUS | Status: DC
  Administered 2021-06-11: 3 mL via INTRAVENOUS

## 2021-06-10 NOTE — Progress Notes (Signed)
Labor Progress Note Mary Quinn is a 30 y.o. G3P2002 at [redacted]w[redacted]d presented for preterm labor S: Pt was originally checked at the 2 hour mark as previous.  Cvx found to be 1 cm dilated.  Pt initially responded to procardia and terbutaline, but the contractions returned within 30-40 minutes.  Pt was started on magnesium sulfate  to try and allow complete betamethasone course.  Pt has continued to contract even with the magnesium sulfate.  GBS prophylaxis was initiated.  O:  BP 98/67   Pulse (!) 106   Temp 97.8 F (36.6 C) (Oral)   Resp 18   LMP 10/15/2020   SpO2 97%  EFM: 140s/accels noted/moderate variability, no decels, category 1 strip  CVE: Dilation: 1 Effacement (%): 90, 100 Cervical Position: Middle Station: -1 Presentation: Homero Fellers Breech Exam by:: Cyprus McHenry RN   A&P: 30 y.o. G3P2002 [redacted]w[redacted]d preterm labor Cvx unchanged in dilation, but much more effaced, continue magnesium sulfate on labor and delivery #Pain: IV pain control prn, pt currently declines #GBS positive   Warden Fillers, MD 6:12 AM

## 2021-06-10 NOTE — Progress Notes (Signed)
Patient screened out for psychosocial assessment since none of the following apply: °Psychosocial stressors documented in mother or baby's chart °Gestation less than 32 weeks °Code at delivery  °Infant with anomalies °Please contact the Clinical Social Worker if specific needs arise, by MOB's request, or if MOB scores greater than 9/yes to question 10 on Edinburgh Postpartum Depression Screen. ° °Anjelique Makar Boyd-Gilyard, MSW, LCSW °Clinical Social Work °[phone] °  °

## 2021-06-10 NOTE — Progress Notes (Addendum)
At one hour check from admission, BP low (see flowsheets). Pt not symptomatic. Notified OB, 500 cc bolus ordered. At one hour re-check, still low (see flowsheets), Pt not symptomatic. RN requested to finish NS bag at another 500cc. Per OB okay to finish bag. At one hour recheck, systolic still <100 and diastolic <60. Pt not symptomatic. Pt and significant other educated to call RN if dizzy/lightheaded. Pt ambulating well to bathroom with RN standby to assist. No new orders. Continue to monitor.

## 2021-06-10 NOTE — Lactation Note (Signed)
This note was copied from a baby's chart. Lactation Consultation Note LC set up pump and assisted with initial pumping. Mom is aware to pump 15 minutes q3 hours. [facility] referral faxed.   Patient Name: [name] [id number] Date: 06/10/2021 Reason for consult: NICU baby;Initial assessment Age:32 hours  Maternal Data Has patient been taught Hand Expression?: Yes Does the patient have breastfeeding experience prior to this delivery?: Yes How long did the patient breastfeed?: 12-18 months Normal breast symmetry and development   Feeding Mother's Current Feeding Choice: Breast Milk  Lactation Tools Discussed/Used Tools: Pump Pump Education: Setup, frequency, and cleaning;Milk Storage Interpreter services   Interventions Interventions: Education  Discharge WIC Program: Yes  Consult Status Consult Status: Follow-up Follow-up type: In-patient  Elder Negus, MA IBCLC 06/10/2021, 3:10 PM

## 2021-06-10 NOTE — Discharge Summary (Addendum)
Postpartum Discharge Summary  Date of Service updated 06/12/21     Patient Name: Mary Quinn DOB: 1991/05/24 MRN: [id number]  Date of admission: 06/09/2021 Delivery date:06/10/2021  Delivering provider: Chancy Milroy  Date of discharge: 06/12/2021  Admitting diagnosis: Encounter for supervision of high risk pregnancy in third trimester, antepartum [O09.93] Preterm labor in third trimester [O60.03] Intrauterine pregnancy: [redacted]w[redacted]d    Secondary diagnosis:  Active Problems:   GBS bacteriuria   Anemia, antepartum   Language barrier   Encounter for supervision of high risk pregnancy in third trimester, antepartum   Preterm labor in third trimester  Additional problems: Breech vaginal delivery     Discharge diagnosis: Preterm Pregnancy Delivered                                              Post partum procedures: None Augmentation: N/A Complications: None  Hospital course: Onset of Labor With Vaginal Delivery      30y.o. yo [id number] admitted in Latent Labor on 06/09/2021. Patient had an complicated labor course including pre-term contractions. Given BMZ x1 and attempted to discontinue contractions with terbutaline x1, procardia, and Mg, however continue to contract with SROM and subsequent delivery.  Membrane Rupture Time/Date: 8:55 AM ,06/10/2021   Delivery Method:Vaginal, Breech  Episiotomy: None  Lacerations:  None  Patient had an uncomplicated postpartum course.  She is ambulating, tolerating a regular diet, passing flatus, and urinating well. Patient is discharged home in stable condition on 06/12/21.  Newborn Data: Birth date:06/10/2021  Birth time:9:14 AM  Gender:Female  Living status:Living  Apgars:7 ,9  Weight:2190 g   Magnesium Sulfate received: Yes: tocolytic BMZ received: Yes Rhophylac:No MMR:No T-DaP:Given prenatally Flu: N/A Transfusion:No  Physical exam  Vitals:   06/10/21 1042 06/10/21 1128 06/10/21 1225 06/10/21 1239  BP: 104/60 (!)  96/43 (!) 90/39 (!) 76/31  Pulse: 65 75 96 92  Resp: _0 Temp:  98.2 F (36.8 C) 98.9 F (37.2 C)   TempSrc:  Oral Oral   SpO2:  100% 99%    General: alert and cooperative, tearful when discussing the baby Lochia: appropriate Uterine Fundus: firm Incision: N/A DVT Evaluation: No evidence of DVT seen on physical exam. No cords or calf tenderness. Labs: Lab Results  Component Value Date   WBC 14.0 (H) 06/09/2021   HGB 11.4 (L) 06/09/2021   HCT 35.1 (L) 06/09/2021   MCV 88.4 06/09/2021   PLT 196 06/09/2021   CMP Latest Ref Rng & Units 06/09/2021  Glucose 70 - 99 mg/dL 93  BUN 6 - 20 mg/dL <5(L)  Creatinine 0.44 - 1.00 mg/dL 0.35(L)  Sodium 135 - 145 mmol/L 132(L)  Potassium 3.5 - 5.1 mmol/L 3.4(L)  Chloride 98 - 111 mmol/L 103  CO2 22 - 32 mmol/L 19(L)  Calcium 8.9 - 10.3 mg/dL 8.6(L)   EdFlavia Shippercore: No flowsheet data found.   After visit meds:  Allergies as of 06/12/2021   No Known Allergies      Medication List     STOP taking these medications    Prenatal Vitamins 28-0.8 MG Tabs       TAKE these medications    acetaminophen 325 MG tablet Commonly known as: Tylenol Take 2 tablets (650 mg total) by mouth every 4 (four) hours as needed (for pain scale < 4).  coconut oil Oil Apply 1 application topically as needed.   ferrous sulfate 325 (65 FE) MG EC tablet Take 1 tablet (325 mg total) by mouth every other day.   ibuprofen 600 MG tablet Commonly known as: ADVIL Take 1 tablet (600 mg total) by mouth every 6 (six) hours.   prenatal multivitamin Tabs tablet Take 1 tablet by mouth daily at 12 noon.         Discharge home in stable condition Infant Feeding: Breast Infant Disposition:NICU Discharge instruction: per After Visit Summary and Postpartum booklet. Activity: Advance as tolerated. Pelvic rest for 6 weeks.  Diet: routine diet Future Appointments: Future Appointments  Date Time Provider [place]  06/21/2021  9:55 AM  Truett Mainland, DO [facility] None  06/28/2021  9:15 AM Truett Mainland, DO [facility] None  07/06/2021  9:55 AM Clarnce Flock, MD [facility] None  07/12/2021  9:55 AM Truett Mainland, DO [facility] None  07/19/2021  9:55 AM Nehemiah Settle Tanna Savoy, DO [facility] None   Follow up Visit:  Message sent by Dr. Higinio Plan to [place] on 06/10/2021:   Please schedule this patient for a In person postpartum visit in 4 weeks with the following provider: Any provider. Additional Postpartum F/U: None    Low risk pregnancy complicated by:  None Delivery mode:  Vaginal, Breech  Anticipated Birth Control:  Unsure   06/12/2021 Radene Gunning, MD

## 2021-06-11 LAB — CBC
HCT: 29.3 % — ABNORMAL LOW (ref 36.0–46.0)
Hemoglobin: 9.6 g/dL — ABNORMAL LOW (ref 12.0–15.0)
MCH: 28.9 pg (ref 26.0–34.0)
MCHC: 32.8 g/dL (ref 30.0–36.0)
MCV: 88.3 fL (ref 80.0–100.0)
Platelets: 197 10*3/uL (ref 150–400)
RBC: 3.32 MIL/uL — ABNORMAL LOW (ref 3.87–5.11)
RDW: 13.4 % (ref 11.5–15.5)
WBC: 17.7 10*3/uL — ABNORMAL HIGH (ref 4.0–10.5)
nRBC: 0 % (ref 0.0–0.2)

## 2021-06-11 LAB — GC/CHLAMYDIA PROBE AMP (~~LOC~~) NOT AT ARMC
Chlamydia: NEGATIVE
Comment: NEGATIVE
Comment: NORMAL
Neisseria Gonorrhea: NEGATIVE

## 2021-06-11 NOTE — Lactation Note (Signed)
This note was copied from a baby's chart. Lactation Consultation Note Mother continues to pump frequently and with normal volume of colostrum. She is aware to f/u with [facility] about picking up pump tomorrow.   Patient Name: Girl Jaziya Obarr SHUOH'F Date: 06/11/2021 Reason for consult: Follow-up assessment Age:32 hours  Maternal Data  Pumping frequency: q 3 with increasing drops of colostrum today  Feeding Mother's Current Feeding Choice: Breast Milk   Interventions Interventions: Education (IDF)   Consult Status Consult Status: Follow-up Follow-up type: In-patient  Elder Negus 06/11/2021, 3:12 PM

## 2021-06-11 NOTE — Progress Notes (Signed)
POSTPARTUM PROGRESS NOTE  PPD #1  Subjective:  Mary Quinn is a 30 y.o. [id number] s/p NSVD at [redacted]w[redacted]d. Today she notes no acute complaints. She denies any problems with ambulating, voiding or po intake. Denies nausea or vomiting. She has passed flatus, +BM.  Pain is well controlled.  Lochia improving Denies fever/chills/chest pain/SOB.  no HA, no blurry vision, noRUQ pain  Objective: Blood pressure (!) 95/54, pulse 65, temperature 98 F (36.7 C), temperature source Oral, resp. rate 16, height 4' 11.02" (1.499 m), weight 59.9 kg, last menstrual period 10/15/2020, SpO2 98 %, unknown if currently breastfeeding.  Physical Exam:  General: alert, cooperative and no distress Chest: no respiratory distress Heart: regular rate and rhythm Abdomen: soft, nontender Uterine Fundus: firm, appropriately tender DVT Evaluation: No calf swelling or tenderness Extremities: no edema Skin: warm, dry  Results for orders placed or performed during the hospital encounter of 06/09/21 (from the past 24 hour(s))  CBC     Status: Abnormal   Collection Time: 06/11/21  4:21 AM  Result Value Ref Range   WBC 17.7 (H) 4.0 - 10.5 K/uL   RBC 3.32 (L) 3.87 - 5.11 MIL/uL   Hemoglobin 9.6 (L) 12.0 - 15.0 g/dL   HCT 51.7 (L) 00.1 - 74.9 %   MCV 88.3 80.0 - 100.0 fL   MCH 28.9 26.0 - 34.0 pg   MCHC 32.8 30.0 - 36.0 g/dL   RDW 44.9 67.5 - 91.6 %   Platelets 197 150 - 400 K/uL   nRBC 0.0 0.0 - 0.2 %    Assessment/Plan: Mary Quinn is a 30 y.o. [phone] s/p NSVD at [redacted]w[redacted]d PPD#1  -pain well controlled -lochia appropriate -meeting milestones appropriately  Contraception: unsure Feeding: breast- currently in NICU  Dispo: continue routine postpartum care   LOS: 2 days   Myna Hidalgo, DO Faculty Attending, Center for [facility] 06/11/2021, 10:06 AM

## 2021-06-12 ENCOUNTER — Ambulatory Visit: Payer: Self-pay

## 2021-06-12 MED ORDER — PRENATAL MULTIVITAMIN CH: 1.0000 | ORAL_TABLET | Freq: Every day | ORAL | Status: AC

## 2021-06-12 MED ORDER — ACETAMINOPHEN 325 MG PO TABS: 650.0000 mg | ORAL_TABLET | ORAL | Status: DC | PRN

## 2021-06-12 MED ORDER — IBUPROFEN 600 MG PO TABS: 600.0000 mg | ORAL_TABLET | Freq: Four times a day (QID) | ORAL | 0 refills | Status: DC

## 2021-06-12 MED ORDER — COCONUT OIL OIL: 1.0000 "application " | TOPICAL_OIL | 0 refills | Status: DC | PRN

## 2021-06-12 NOTE — Lactation Note (Signed)
This note was copied from a baby's chart. Lactation Consultation Note  Patient Name: Mary Quinn [id number] Date: 06/12/2021   Age:32 hours  FOB came to the hospital to visit baby and requested a pump for mom. RN Debbie explained to FOB (LC was on Vocera) that we don't do WIC loaners on week days.   This LC checked chart and verified that [facility] referral was faxed on admission. Explained to FOB that they'll need to contact the [facility] office to schedule an appt for pump issuance. FOB voiced understanding and said they'll be calling the [facility] office tomorrow first thing in the morning.  LC to F/U regarding pumping status and WIC pump; mom was discharged today but she didn't go to the [facility] office to P/U her pump.    Lynlee Stratton S Copelyn Widmer 06/12/2021, 7:00 PM

## 2021-06-12 NOTE — Progress Notes (Signed)
Discharge instructions given to patient using interpreter services. Postpartum care, follow up appointment, and medications reviewed. Mary Quinn completed with interpreter. Questions answered, IV removed.

## 2021-06-13 LAB — SURGICAL PATHOLOGY

## 2021-06-17 ENCOUNTER — Ambulatory Visit: Payer: Self-pay

## 2021-06-17 NOTE — Lactation Note (Signed)
This note was copied from a baby's chart. Lactation Consultation Note  Patient Name: Mary Quinn [id number]'H Date: 06/17/2021   Age:32 days  Attempted to visit with mom but she wasn't in the room, she didn't come to visit today. LC spoke to FOB over the phone to follow up on mom's pumping status. She's been using her hand pump because they haven't called the [facility] office in [place] to set up an appt.  Explained to FOB that they don't take walk-in, that he needs to call and make an appt for pump issuance. Fax referral was sent on admission to the [place] office and this LC provided the [place] [facility] office phone number to FOB in order to contact them.   Lenia Housley S Lorilynn Lehr 06/17/2021, 7:26 PM

## 2021-06-20 ENCOUNTER — Telehealth (HOSPITAL_COMMUNITY): Payer: Self-pay | Admitting: *Deleted

## 2021-06-20 NOTE — Telephone Encounter (Signed)
Attempted hospital discharge follow-up call using the Language Line. Patient's husband answered the phone - mom not at home at this time. Deforest Hoyles, RN, 06/20/21, [phone].

## 2021-06-21 ENCOUNTER — Encounter: Payer: Medicaid Other | Admitting: Family Medicine

## 2021-06-26 ENCOUNTER — Ambulatory Visit: Payer: Self-pay

## 2021-06-26 NOTE — Lactation Note (Signed)
This note was copied from a baby's chart. Lactation Consultation Note  Patient Name: Mary Quinn IWOEH'O Date: 06/26/2021   Age:32 wk.o.  Attempted to visit with mom again, but she had already left for the day, NICU RN reported that parents only stay briefly for about 1/2 hour to an hour; usually around 11 am or noon.  NICU RN reported mom is still pumping and getting about 120 ml/24 hours. NICU LC to follow up tomorrow to see if she picked up her pump from the [facility] office.   [name] [name] 06/26/2021, 7:57 PM

## 2021-06-28 ENCOUNTER — Encounter: Payer: Medicaid Other | Admitting: Family Medicine

## 2021-06-30 ENCOUNTER — Ambulatory Visit: Payer: Self-pay

## 2021-06-30 NOTE — Lactation Note (Signed)
This note was copied from a baby's chart. Lactation Consultation Note  Patient Name: Mary Quinn [id number] Date: 06/30/2021 Reason for consult: Follow-up assessment;NICU baby;Late-preterm 34-36.6wks Age:32 wk.o.  Attempted to visit with mom of 87 73/63 weeks old (adjusted) NICU female, but she wasn't in the room, LC called FOB (he speaks [place]) and he was able to provide an update on pumping status.  LC scheduled an appt with parents for 09/12 at 11 am; FOB voiced they can't come on weekends because they have other kids at home, but they can come during the day when their kids are at school.  Continue current plan of care, NICU LC to F/U on Monday.   Maternal Data   Mom's milk supply is BNL  Feeding Mother's Current Feeding Choice: Breast Milk  Lactation Tools Discussed/Used Tools: Pump Breast pump type: Double-Electric Breast Pump Pump Education: Setup, frequency, and cleaning;Milk Storage Reason for Pumping: LPI in NICU Pumping frequency: q 3 hours Pumped volume: 45 mL (45-60 ml)  Interventions Interventions: Education  Discharge    Consult Status Consult Status: Follow-up Date: 06/30/21 Follow-up type: In-patient    Malissia Rabbani Venetia Constable 06/30/2021, 6:35 PM

## 2021-07-02 ENCOUNTER — Ambulatory Visit: Payer: Self-pay

## 2021-07-02 NOTE — Lactation Note (Signed)
This note was copied from a baby's chart. Lactation Consultation Note Mother is concerned about diminishing milk volume while pumping. She reported volume wnl in previous weeks and bf her last baby for 18 months without difficulty. She continues to pump frequently and according to pumping recommendations. We reviewed the difficulty of pump dependence and likelihood of her supply increasing when baby is able to directly bf. I recommended to continue with current plan of care and will f/u next week.  Patient Name: Mary Quinn YBWLS'L Date: 07/02/2021 Reason for consult: Follow-up assessment Age:32 wk.o.  Maternal Data  Pumping frequency: q3 62mL per pumping  Feeding Mother's Current Feeding Choice: Breast Milk and Donor Milk   Consult Status Consult Status: Follow-up Date: 07/02/21 Follow-up type: In-patient   Elder Negus 07/02/2021, 12:39 PM

## 2021-07-06 ENCOUNTER — Encounter: Payer: Medicaid Other | Admitting: Family Medicine

## 2021-07-11 ENCOUNTER — Ambulatory Visit: Payer: Self-pay

## 2021-07-11 NOTE — Lactation Note (Signed)
This note was copied from a baby's chart. Lactation Consultation Note Mother's milk supply remains low but has improved this week. She continues to pump frequently.   Patient Name: Girl Xara Paulding LKGMW'N Date: 07/11/2021 Reason for consult: Follow-up assessment Age:32 wk.o.  Maternal Data  Pumping frequency: 7x day 40-81ml/pumping  Feeding Mother's Current Feeding Choice: Breast Milk and Formula  Interventions Interventions: Education   Consult Status Consult Status: Follow-up Date: 07/11/21 Follow-up type: In-patient  Elder Negus 07/11/2021, 2:24 PM

## 2021-07-12 ENCOUNTER — Other Ambulatory Visit: Payer: Self-pay

## 2021-07-12 ENCOUNTER — Encounter: Payer: Self-pay | Admitting: Family Medicine

## 2021-07-12 ENCOUNTER — Ambulatory Visit (INDEPENDENT_AMBULATORY_CARE_PROVIDER_SITE_OTHER): Payer: Medicaid Other | Admitting: Family Medicine

## 2021-07-12 DIAGNOSIS — Z30013 Encounter for initial prescription of injectable contraceptive: Secondary | ICD-10-CM | POA: Diagnosis not present

## 2021-07-12 MED ORDER — MEDROXYPROGESTERONE ACETATE 150 MG/ML IM SUSP
150.0000 mg | Freq: Once | INTRAMUSCULAR | Status: AC
  Administered 2021-07-12: 150 mg via INTRAMUSCULAR

## 2021-07-12 NOTE — Addendum Note (Signed)
Addended by: Lorelle Gibbs L on: 07/12/2021 10:42 AM   Modules accepted: Orders

## 2021-07-12 NOTE — Progress Notes (Signed)
error 

## 2021-07-12 NOTE — Progress Notes (Signed)
Post Partum Visit Note  Mary Quinn is a 30 y.o. [phone] female who presents for a postpartum visit. She is 4 weeks postpartum following a Vaginal Breech .  I have fully reviewed the prenatal and intrapartum course. The delivery was at 34 gestational weeks.  Anesthesia: none. Postpartum course has been normal. Baby is doing well - still in the NICU because of oxygen levels. Baby is feeding by breast. Bleeding no bleeding. Bowel function is normal. Bladder function is normal. Patient is not sexually active. Contraception method is Depo-Provera injections. Postpartum depression screening: negative.   The pregnancy intention screening data noted above was reviewed. Potential methods of contraception were discussed. The patient elected to proceed with No data recorded.   Edinburgh Postnatal Depression Scale - 07/12/21 1012       Edinburgh Postnatal Depression Scale:  In the Past 7 Days   I have been able to laugh and see the funny side of things. 0    I have looked forward with enjoyment to things. 0    I have blamed myself unnecessarily when things went wrong. 0    I have been anxious or worried for no good reason. 0    I have felt scared or panicky for no good reason. 0    Things have been getting on top of me. 0    I have been so unhappy that I have had difficulty sleeping. 0    I have felt sad or miserable. 0    I have been so unhappy that I have been crying. 0    The thought of harming myself has occurred to me. 0    Edinburgh Postnatal Depression Scale Total 0             Health Maintenance Due  Topic Date Due   TETANUS/TDAP  Never done   INFLUENZA VACCINE  Never done    The following portions of the patient's history were reviewed and updated as appropriate: allergies, current medications, past family history, past medical history, past social history, past surgical history, and problem list.  Review of Systems Pertinent items are noted in HPI.  Objective:  BP  114/63   Pulse (!) 56   Wt 126 lb (57.2 kg)   LMP 10/15/2020   Breastfeeding Yes   BMI 25.44 kg/m    General:  alert, cooperative, and no distress   Breasts:  not indicated  Lungs: clear to auscultation bilaterally  Heart:  regular rate and rhythm, S1, S2 normal, no murmur, click, rub or gallop  Abdomen: soft, non-tender; bowel sounds normal; no masses,  no organomegaly   Wound N/a  GU exam:  normal       Assessment:     1. Postpartum exam       Plan:   Essential components of care per ACOG recommendations:  1.  Mood and well being: Patient with negative depression screening today. Reviewed local resources for support.  - Patient tobacco use? No.   - hx of drug use? No.    2. Infant care and feeding:  -Patient currently breastmilk feeding? Yes. Reviewed importance of draining breast regularly to support lactation.  -Social determinants of health (SDOH) reviewed in EPIC. No concerns  3. Sexuality, contraception and birth spacing - Patient does not want a pregnancy in the next year.    - Reviewed forms of contraception in tiered fashion. Patient desired Depo-Provera today.   - Discussed birth spacing of 18 months  4. Sleep and  fatigue -Encouraged family/partner/community support of 4 hrs of uninterrupted sleep to help with mood and fatigue  5. Physical Recovery  - Discussed patients delivery and complications. She describes her labor as good. - Patient had a Vaginal, no problems at delivery.  - Patient has urinary incontinence? No. - Patient is safe to resume physical and sexual activity  6.  Health Maintenance - [place] due items addressed Yes - Last pap smear  Diagnosis  Date Value Ref Range Status  02/14/2021   Final   - Negative for intraepithelial lesion or malignancy (NILM)   Pap smear not done at today's visit.  -Breast Cancer screening indicated? No.   7. Chronic Disease/Pregnancy Condition follow up: None  - PCP follow up  Levie Heritage, DO Center  for [facility], [facility]

## 2021-07-19 ENCOUNTER — Encounter: Payer: Medicaid Other | Admitting: Family Medicine

## 2021-07-28 ENCOUNTER — Ambulatory Visit: Payer: Self-pay

## 2021-07-28 NOTE — Lactation Note (Signed)
This note was copied from a baby's chart. Lactation Consultation Note  Patient Name: Mary Quinn QQVZD'G Date: 07/28/2021   Age:32 wk.o.  Attempt to visit with mom but she wasn't in the room. LC left a voicemail asking her to call back and provide an update on pumping status. Mom aware to call NICU LC the next time she comes to the hospital.   [name] 07/28/2021, 5:30 PM

## 2021-07-30 ENCOUNTER — Ambulatory Visit: Payer: Self-pay

## 2021-07-30 NOTE — Lactation Note (Signed)
This note was copied from a baby's chart. Lactation Consultation Note LC present for observed lick & learn bf'ing. Mother did not pre-pump but states her milk supply is low and her breasts feel empty. LC observed independent latch with normal suckling sequence. Mother apparently had a milk let down after about 90 seconds and baby came off coughing and with brief desat to the 80's. She recovered independently and resumed bf'ing. At about 7 minutes, mother had another milk letdown and baby repeated earlier behavior. LC encouraged mother to discontinue bf'ing at that time. LC reviewed need to pre-pump even if mom's milk supply is perceived low. Parents request to repeat lick and learn tomorrow. LC scheduled for 1200 feeding and requested SLP co-consult. SLP agreed.  Mother to pre-pump.  Patient Name: Mary Quinn HENID'P Date: 07/30/2021 Reason for consult: Follow-up assessment;Breastfeeding assistance Age:32 wk.o.  Maternal Data  Pumped volume: 30 mL (unknown frequency)   LATCH Score Latch: Grasps breast easily, tongue down, lips flanged, rhythmical sucking.  Audible Swallowing: A few with stimulation  Type of Nipple: Everted at rest and after stimulation  Comfort (Breast/Nipple): Soft / non-tender  Hold (Positioning): No assistance needed to correctly position infant at breast.  LATCH Score: 9  Interventions Interventions: Education;Visual merchandiser education  Consult Status Consult Status: Follow-up Date: 07/30/21 Follow-up type: In-patient   Mary Quinn 07/30/2021, 12:47 PM

## 2021-07-31 ENCOUNTER — Ambulatory Visit: Payer: Self-pay

## 2021-07-31 NOTE — Lactation Note (Signed)
This note was copied from a baby's chart. Lactation Consultation Note  Patient Name: Mary Quinn WERXV'Q Date: 07/31/2021 Reason for consult: Follow-up assessment;Breastfeeding assistance;NICU baby;Term Age:32 wk.o.  Offered an interpreter for [place] but mom declined and chooses to use FOB as the interpreter for this encounter.  Visited with mom of 89 31/64 weeks old (adjusted) NICU female for a feeding assist. Mom did not pump before this feeding baby showing some emerging cues and just waking up.  LC assisted with hand expression and finger feeding but baby would not suck on a gloved finger or at the breast, she fell asleep. SLP Anise Salvo and LC reviewed BF education with parents and explained to them why baby can't go to a full breast yet.   LC set up a DEBP in baby's room, mom still pumping when exiting the room. Reviewed feeding cues, pumping schedule, power pumping and benefits of STS care.  Plan of care:  Encouraged mom to continue pumping every 3 hours; especially during the night She'll increase the duration of her pumping session in the morning if she misses an overnight session She'll continue taking baby to an EMPTY/PUMPED breast strictly on feeding cues  FOB present and supportive. All questions and concerns answered, parents to call NICU LC PRN.  Maternal Data   Mom's supply is BLN  Feeding Mother's Current Feeding Choice: Breast Milk and Formula  Lactation Tools Discussed/Used Tools: Pump;Flanges Flange Size: 24 Breast pump type: Double-Electric Breast Pump Reason for Pumping: NICU stay Pumping frequency: 7 times/24 hours Pumped volume: 35 mL  Interventions Interventions: DEBP;Education;Infant Driven Feeding Algorithm education;Assisted with latch;Breast massage;Hand express;Support pillows;Adjust position  Discharge Pump: DEBP  Consult Status Consult Status: Follow-up Date: 07/31/21 Follow-up type: In-patient   [name] 07/31/2021, 12:36  PM

## 2021-08-10 ENCOUNTER — Ambulatory Visit: Payer: Self-pay

## 2021-08-10 NOTE — Lactation Note (Signed)
This note was copied from a baby's chart.  NICU Lactation Consultation Note  Patient Name: Mary Quinn YBWLS'L Date: 08/10/2021 Age:32 m.o.   Subjective Reason for consult: Follow-up assessment Mother continues to pump frequently. She declines to bf at this time and prefers to only bottle feed.   Objective Infant data: Mother's Current Feeding Choice: Breast Milk and Formula  Infant feeding assessment Scale for Readiness: 2 Scale for Quality: 3   Maternal data: [id number]  Vaginal, Breech Current breast feeding challenges:: low milk supply  Pumping frequency: q3 Pumped volume: 30 mL   Assessment Infant: Maternal: Milk volume: Low This mother bf her last baby without difficulty. Her milk supply will likely improve when baby begins directly breastfeeding.   Intervention/Plan Interventions: Breast feeding basics reviewed  Tools: 23F feeding tube / Syringe; Pump  Plan: Consult Status: Follow-up  NICU Follow-up type: Weekly NICU follow up   Elder Negus 08/10/2021, 1:08 PM

## 2021-08-16 ENCOUNTER — Ambulatory Visit: Payer: Self-pay

## 2021-08-16 NOTE — Lactation Note (Signed)
This note was copied from a baby's chart. Lactation Consultation Note  Patient Name: Mary Quinn [id number]'R Date: 08/16/2021   No charge for this visit. Lactation stopped by the room to follow up with [name] upon discharge. RN, Chyrel Masson, was providing D/C education. I provided the community resources brochure and Chyrel Masson asked parents if they would like a lactation OP appointment. [name] declined.   Age:32 m.o.  Feeding Nipple Type: Dr. Levert Feinstein Preemie   Consult Status Consult Status: Complete Date: 08/16/21    Walker Shadow 08/16/2021, 2:12 PM

## 2021-10-01 ENCOUNTER — Ambulatory Visit (INDEPENDENT_AMBULATORY_CARE_PROVIDER_SITE_OTHER): Payer: Medicaid Other

## 2021-10-01 ENCOUNTER — Other Ambulatory Visit: Payer: Self-pay

## 2021-10-01 VITALS — BP 94/64 | HR 68

## 2021-10-01 DIAGNOSIS — Z3042 Encounter for surveillance of injectable contraceptive: Secondary | ICD-10-CM | POA: Diagnosis not present

## 2021-10-01 MED ORDER — MEDROXYPROGESTERONE ACETATE 150 MG/ML IM SUSP
150.0000 mg | INTRAMUSCULAR | 3 refills | Status: AC
Start: 2021-10-01 — End: ?

## 2021-10-01 MED ORDER — MEDROXYPROGESTERONE ACETATE 150 MG/ML IM SUSP: 150.0000 mg | INTRAMUSCULAR | 0 refills | Status: DC

## 2021-10-01 MED ORDER — MEDROXYPROGESTERONE ACETATE 150 MG/ML IM SUSP
150.0000 mg | Freq: Once | INTRAMUSCULAR | Status: AC
  Administered 2021-10-01: 150 mg via INTRAMUSCULAR

## 2021-10-01 NOTE — Progress Notes (Addendum)
Patient presents for Depo Provera injection. Patient is still breastfeeding. Armandina Stammer RN   Attestation of Attending Supervision of RN: Evaluation and management procedures were performed by the nurse under my supervision and collaboration.  I have reviewed the nursing note and chart, and I agree with the management and plan.  Carolyn L. Harraway-Smith, M.D., Evern Core

## 2021-12-18 ENCOUNTER — Other Ambulatory Visit: Payer: Self-pay

## 2021-12-18 ENCOUNTER — Ambulatory Visit (INDEPENDENT_AMBULATORY_CARE_PROVIDER_SITE_OTHER): Payer: Medicaid Other

## 2021-12-18 VITALS — BP 108/69 | HR 61 | Wt 136.0 lb

## 2021-12-18 DIAGNOSIS — Z3042 Encounter for surveillance of injectable contraceptive: Secondary | ICD-10-CM | POA: Diagnosis not present

## 2021-12-18 MED ORDER — MEDROXYPROGESTERONE ACETATE 150 MG/ML IM SUSP
150.0000 mg | Freq: Once | INTRAMUSCULAR | Status: AC
  Administered 2021-12-18: 150 mg via INTRAMUSCULAR

## 2021-12-18 NOTE — Progress Notes (Signed)
Celesta Gentile here for Depo-Provera  Injection.  Injection administered without complication. Patient will return in 3 months for next injection.  Language services interpreter Raford Pitcher [phone]  Semiah Konczal l Paige Vanderwoude, CMA 12/18/2021  10:20 AM

## 2022-03-12 ENCOUNTER — Ambulatory Visit (INDEPENDENT_AMBULATORY_CARE_PROVIDER_SITE_OTHER): Payer: Medicaid Other

## 2022-03-12 VITALS — BP 94/64 | HR 68

## 2022-03-12 DIAGNOSIS — Z789 Other specified health status: Secondary | ICD-10-CM

## 2022-03-12 DIAGNOSIS — Z3042 Encounter for surveillance of injectable contraceptive: Secondary | ICD-10-CM | POA: Diagnosis not present

## 2022-03-12 MED ORDER — MEDROXYPROGESTERONE ACETATE 150 MG/ML IM SUSP
150.0000 mg | Freq: Once | INTRAMUSCULAR | Status: AC
  Administered 2022-03-12: 150 mg via INTRAMUSCULAR

## 2022-03-12 NOTE — Progress Notes (Cosign Needed Addendum)
Patient presents for Depo Provera.  Interpreter [id number] Date last pap: 02/14/2021. Last Depo-Provera: 10/01/2021. Side Effects if any: none. Serum HCG indicated? NA. Depo-Provera 150 mg IM given by: Burnard Leigh RN . Next appointment due 06/04/22 at 10 am.   Armandina Stammer RN    Patient was assessed and managed by nursing staff during this encounter. I have reviewed the chart and agree with the documentation and plan. I have also made any necessary editorial changes.  Wynelle Bourgeois, CNM 03/12/2022 1:35 PM

## 2022-05-09 ENCOUNTER — Encounter: Payer: Self-pay | Admitting: General Practice

## 2022-06-04 ENCOUNTER — Ambulatory Visit (INDEPENDENT_AMBULATORY_CARE_PROVIDER_SITE_OTHER): Payer: Medicaid Other

## 2022-06-04 VITALS — BP 102/66 | HR 70 | Wt 136.0 lb

## 2022-06-04 DIAGNOSIS — Z3042 Encounter for surveillance of injectable contraceptive: Secondary | ICD-10-CM | POA: Diagnosis not present

## 2022-06-04 MED ORDER — MEDROXYPROGESTERONE ACETATE 150 MG/ML IM SUSP
150.0000 mg | Freq: Once | INTRAMUSCULAR | Status: AC
  Administered 2022-06-04: 150 mg via INTRAMUSCULAR

## 2022-06-04 NOTE — Progress Notes (Cosign Needed)
Date last pap: 02/14/2021. Last Depo-Provera: 03/12/22. Side Effects if any: none. Serum HCG indicated? none. Depo-Provera 150 mg IM given by: J.Gurtha Picker RN . Next appointment due schedule with front desk.

## 2022-08-11 IMAGING — US US MFM OB COMP +14 WKS
1 series · 13 of 28 positions shown · non-contrast
Comparison: none

[Series 1: us mfm ob comp +14 wks · 13 of 146 slices shown]
[im 6/146]
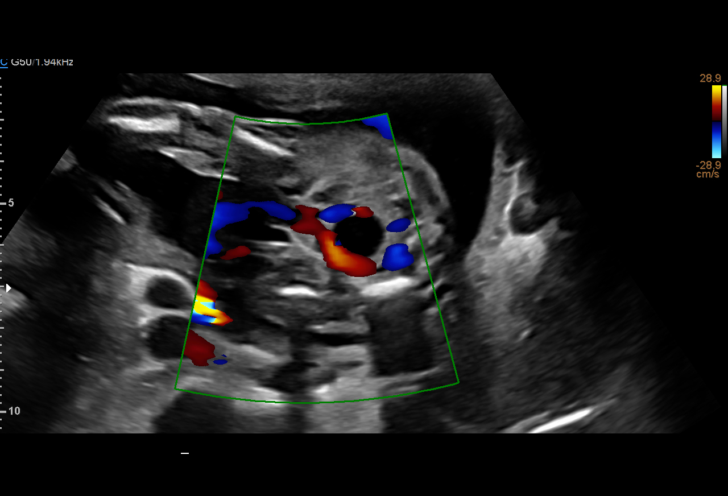
[im 17/146]
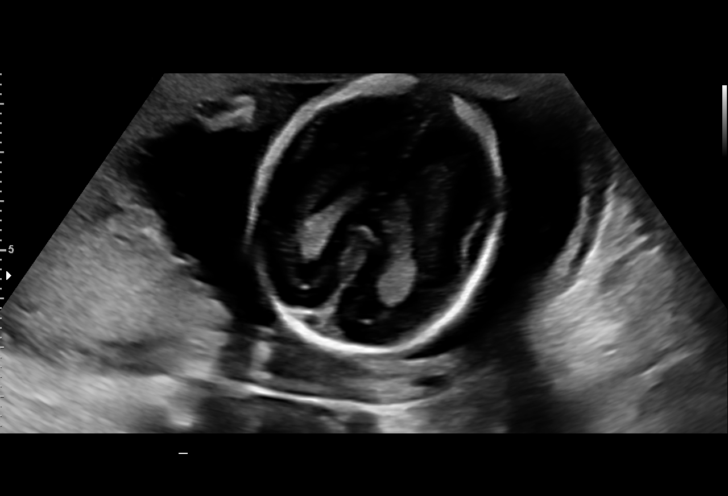
[im 27/146]
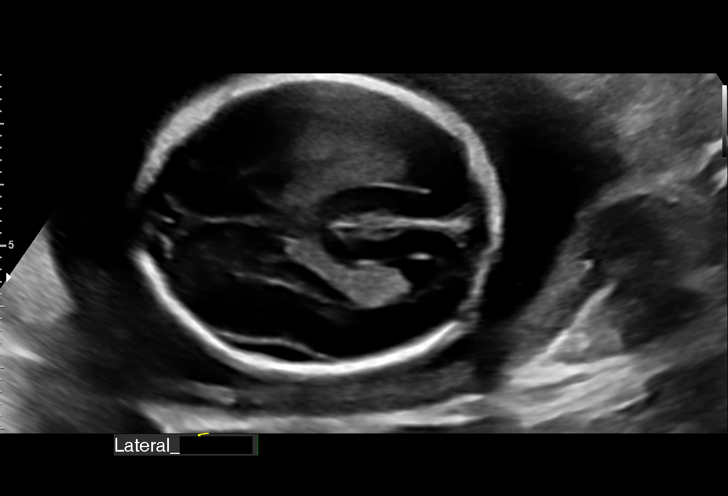
[im 38/146]
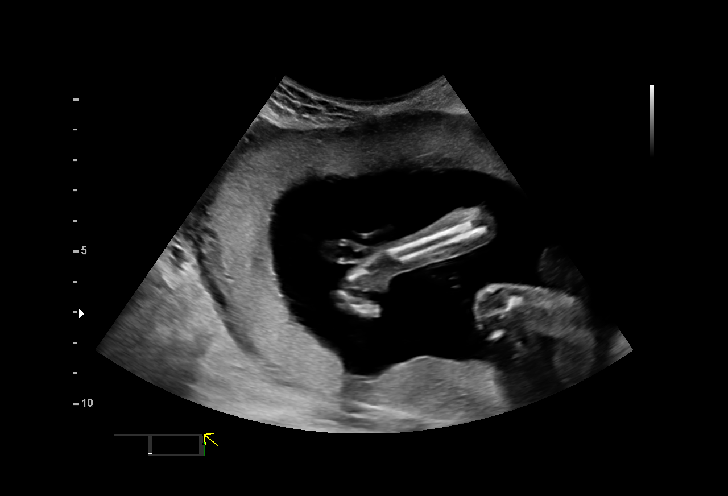
[im 49/146]
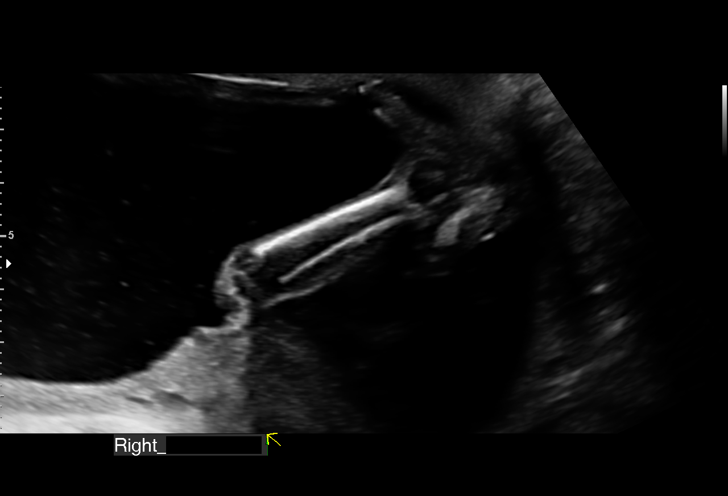
[im 60/146]
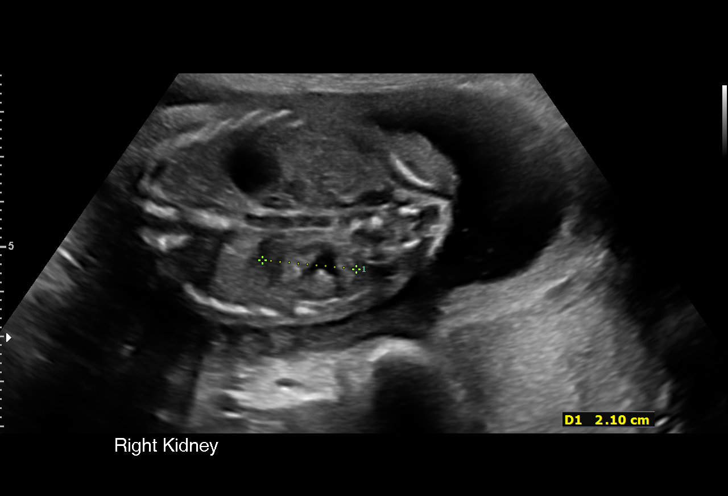
[im 76/146]
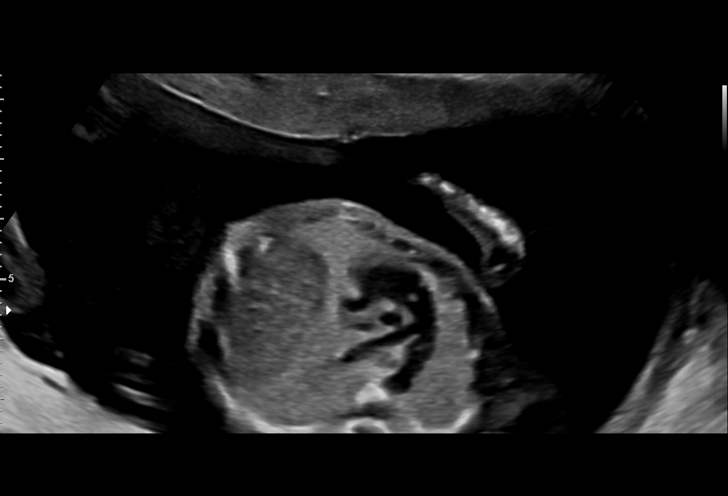
[im 86/146]
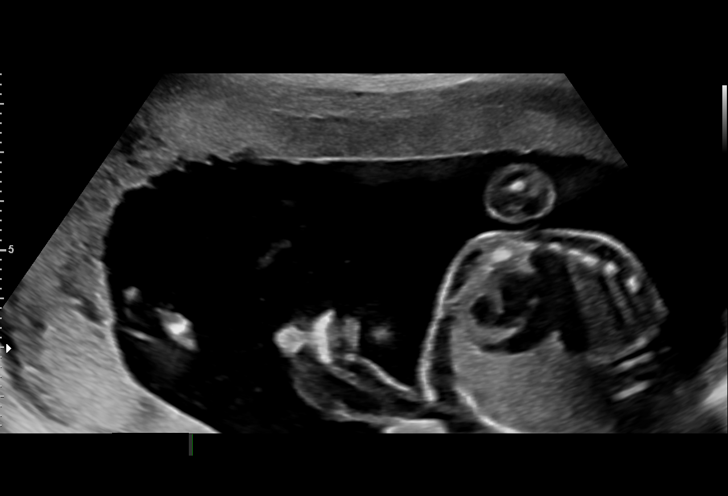
[im 97/146]
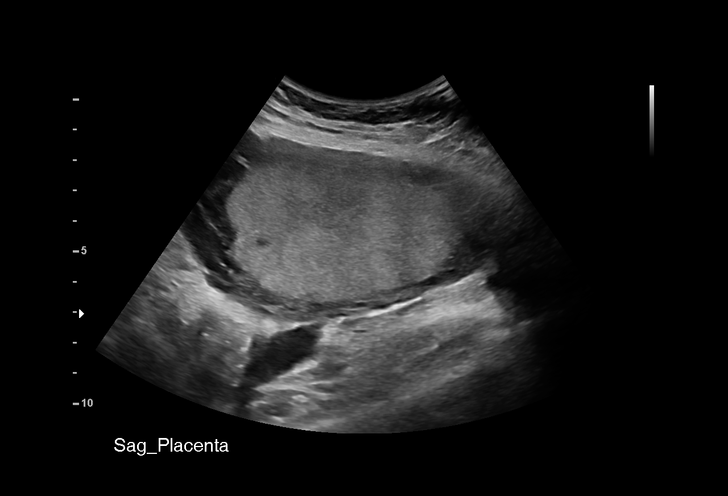
[im 108/146]
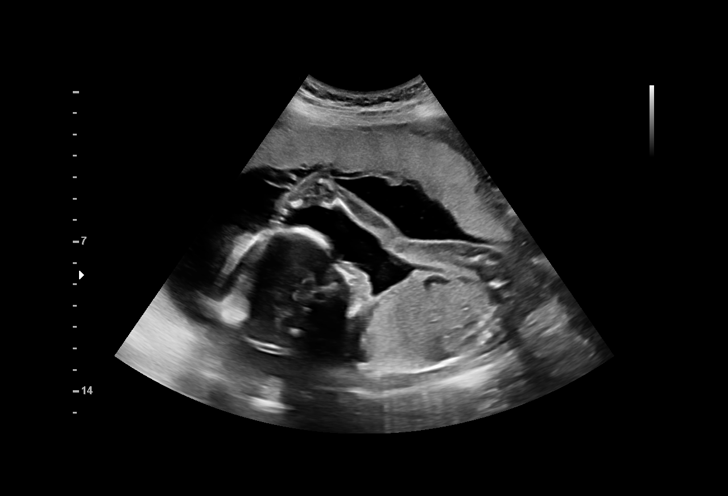
[im 119/146]
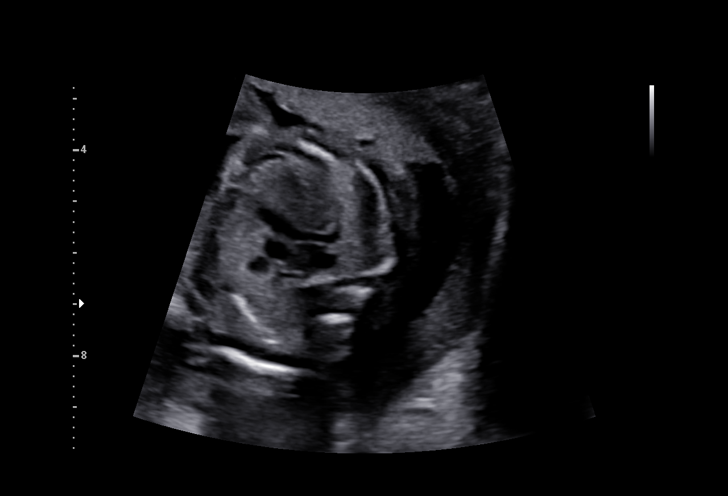
[im 129/146]
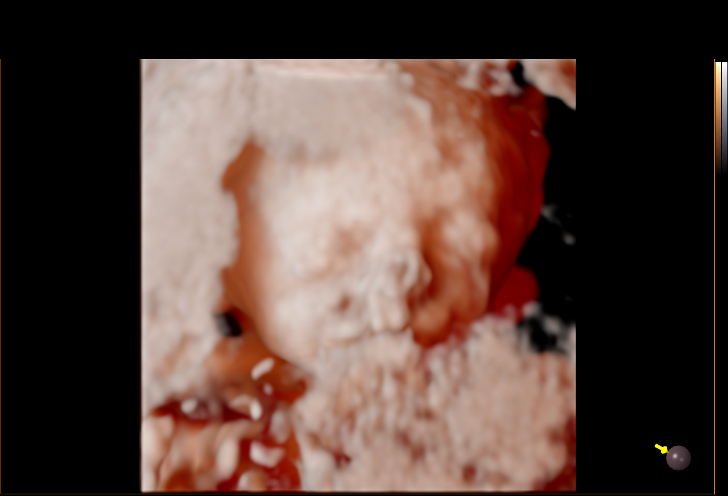
[im 140/146]
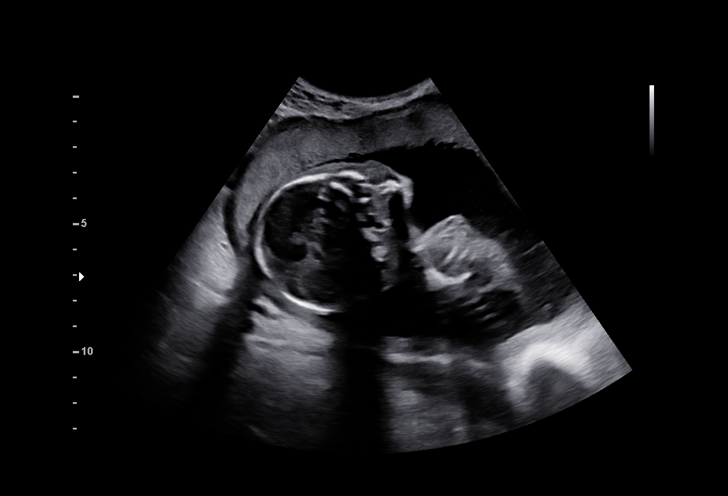

[13 of 28 positions shown; findings below may reference images not displayed]

Indications

 Fetal abnormality - other known or suspected
 Encounter for antenatal screening for
 malformations
 20 weeks gestation of pregnancy
Fetal Evaluation

 Num Of Fetuses:         1
 Fetal Heart Rate(bpm):  147
 Cardiac Activity:       Observed
 Presentation:           Breech
 Placenta:               Right lateral
 P. Cord Insertion:      Visualized

 Amniotic Fluid
 AFI FV:      Within normal limits

                             Largest Pocket(cm)

Biometry

 BPD:      49.3  mm     G. Age:  20w 6d         75  %    CI:        75.07   %    70 - 86
                                                         FL/HC:      19.0   %    16.8 -
 HC:      180.5  mm     G. Age:  20w 3d         50  %    HC/AC:      1.12        1.09 -
 AC:      161.2  mm     G. Age:  21w 1d         74  %    FL/BPD:     69.6   %
 FL:       34.3  mm     G. Age:  20w 6d         60  %    FL/AC:      21.3   %    20 - 24
 HUM:      34.9  mm     G. Age:  22w 0d       > 95  %
 CER:      22.1  mm     G. Age:  20w 5d         84  %
 NFT:       3.7  mm

 LV:        6.8  mm
 CM:        3.6  mm

 Est. FW:     389  gm    0 lb 14 oz      81  %
OB History

 Gravidity:    3         Term:   2
Gestational Age

 LMP:           20w 2d        Date:  10/15/20                 EDD:   07/22/21
 U/S Today:     20w 6d                                        EDD:   07/18/21
 Best:          20w 2d     Det. By:  LMP  (10/15/20)          EDD:   07/22/21
Anatomy

 Cranium:               Appears normal         LVOT:                   Appears normal
 Cavum:                 Appears normal         Aortic Arch:            Appears normal
 Ventricles:            Appears normal         Ductal Arch:            Appears normal
 Choroid Plexus:        Appears normal         Diaphragm:              Appears normal
 Cerebellum:            Appears normal         Stomach:                Appears normal, left
                                                                       sided
 Posterior Fossa:       Appears normal         Abdomen:                Appears normal
 Nuchal Fold:           Appears normal         Abdominal Wall:         Appears nml (cord
                                                                       insert, abd wall)
 Face:                  Appears normal         Cord Vessels:           Appears normal (3
                        (orbits and profile)                           vessel cord)
 Lips:                  Appears normal         Kidneys:                Appear normal
 Palate:                Not well visualized    Bladder:                Appears normal
 Thoracic:              Appears normal         Spine:                  Not well visualized
 Heart:                 Appears normal         Upper Extremities:      Appears normal
                        (4CH, axis, and
                        situs)
 RVOT:                  Appears normal         Lower Extremities:      Appears normal

 Other:  Fetus appears to be female. Heels and 5th digit visualized. Open
         hands visualized. Nasal bone visualized. Technically difficult due to
         fetal position.
Cervix Uterus Adnexa

 Cervix
 Length:           3.86  cm.
 Normal appearance by transabdominal scan.
Comments

 This patient was seen for a detailed fetal anatomy scan.
 She denies any significant past medical history and denies
 any problems in her current pregnancy.
 She has not had any screening tests for fetal aneuploidy
 drawn in her current pregnancy.
 She was informed that the fetal growth and amniotic fluid
 level were appropriate for her gestational age.
 There were no obvious fetal anomalies noted on today's
 ultrasound exam.
 The patient was informed that anomalies may be missed due
 to technical limitations. If the fetus is in a suboptimal position
 or maternal habitus is increased, visualization of the fetus in
 the maternal uterus may be impaired.
 The patient reports that the first day of her last menstrual
 period was October 15, 2020.  Based on this report, her
 EDC was changed to July 22, 2021.
 A follow-up exam was scheduled in 4 weeks to confirm her
 dates.

## 2022-08-22 ENCOUNTER — Encounter: Payer: Self-pay | Admitting: Family Medicine

## 2022-08-22 ENCOUNTER — Ambulatory Visit (INDEPENDENT_AMBULATORY_CARE_PROVIDER_SITE_OTHER): Payer: Medicaid Other | Admitting: Family Medicine

## 2022-08-22 VITALS — BP 113/62 | HR 71 | Wt 133.0 lb

## 2022-08-22 DIAGNOSIS — Z01419 Encounter for gynecological examination (general) (routine) without abnormal findings: Secondary | ICD-10-CM

## 2022-08-22 DIAGNOSIS — Z3042 Encounter for surveillance of injectable contraceptive: Secondary | ICD-10-CM

## 2022-08-22 MED ORDER — MEDROXYPROGESTERONE ACETATE 150 MG/ML IM SUSP
150.0000 mg | Freq: Once | INTRAMUSCULAR | Status: AC
  Administered 2022-08-22: 150 mg via INTRAMUSCULAR

## 2022-08-22 NOTE — Progress Notes (Signed)
   ANNUAL EXAM Patient name: Mary Quinn MRN [id number]  Date of birth: 04-22-1991 Chief Complaint:   Annual Exam  History of Present Illness:   Mary Quinn is a 31 y.o.  [phone]  female  being seen today for a routine annual exam.  Current complaints: none. No bleeding with depo provera  No LMP recorded. Patient has had an injection.    Last pap     Component Value Date/Time   DIAGPAP  02/14/2021 1639    - Negative for intraepithelial lesion or malignancy (NILM)   HPVHIGH Negative 02/14/2021 1639   ADEQPAP  02/14/2021 1639    Satisfactory for evaluation; transformation zone component PRESENT.   Last mammogram: n/a.     02/14/2021    4:47 PM  Depression screen PHQ 2/9  Decreased Interest 0  Down, Depressed, Hopeless 0  PHQ - 2 Score 0  Altered sleeping 0  Tired, decreased energy 0  Change in appetite 0  Feeling bad or failure about yourself  0  Trouble concentrating 0  Moving slowly or fidgety/restless 0  Suicidal thoughts 0  PHQ-9 Score 0        02/14/2021    4:48 PM  GAD 7 : Generalized Anxiety Score  Nervous, Anxious, on Edge 0  Control/stop worrying 0  Worry too much - different things 0  Trouble relaxing 0  Restless 0  Easily annoyed or irritable 0  Afraid - awful might happen 0  Total GAD 7 Score 0     Review of Systems:   Pertinent items are noted in HPI Denies any headaches, blurred vision, fatigue, shortness of breath, chest pain, abdominal pain, abnormal vaginal discharge/itching/odor/irritation, problems with periods, bowel movements, urination, or intercourse unless otherwise stated above. Pertinent History Reviewed:  Reviewed past medical,surgical, social and family history.  Reviewed problem list, medications and allergies. Physical Assessment:   Vitals:   08/22/22 1102  BP: 113/62  Pulse: 71  Weight: 133 lb (60.3 kg)  Body mass index is 26.85 kg/m.        Physical Examination:   General appearance - well appearing, and in no  distress  Mental status - alert, oriented to person, place, and time  Psych:  She has a normal mood and affect  Skin - warm and dry, normal color, no suspicious lesions noted  Chest - effort normal, all lung fields clear to auscultation bilaterally  Heart - normal rate and regular rhythm  Neck:  midline trachea, no thyromegaly or nodules  Breasts - breasts appear normal, no suspicious masses, no skin or nipple changes or axillary nodes  Abdomen - soft, nontender, nondistended, no masses or organomegaly  Pelvic - VULVA: normal appearing vulva with no masses, tenderness or lesions  VAGINA: normal appearing vagina with normal color and discharge, no lesions  CERVIX: normal appearing cervix without discharge or lesions, no CMT  Thin prep pap is not done   UTERUS: uterus is felt to be normal size, shape, consistency and nontender   ADNEXA: No adnexal masses or tenderness noted.  Extremities:  No swelling or varicosities noted  Chaperone present for exam  Assessment & Plan:  1. Well woman exam with routine gynecological exam Continue depo   Labs/procedures today: none  No orders of the defined types were placed in this encounter.   Meds: No orders of the defined types were placed in this encounter.   Follow-up: No follow-ups on file.  Truett Mainland, DO 08/22/2022 11:28 AM

## 2022-08-22 NOTE — Addendum Note (Signed)
Addended by: Phill Myron on: 08/22/2022 01:33 PM   Modules accepted: Orders

## 2022-08-22 NOTE — Progress Notes (Signed)
Interpreter used [phone] used for entire visit. Kathrene Alu RN

## 2022-11-08 ENCOUNTER — Other Ambulatory Visit: Payer: Self-pay

## 2022-11-08 DIAGNOSIS — Z30013 Encounter for initial prescription of injectable contraceptive: Secondary | ICD-10-CM

## 2022-11-08 MED ORDER — MEDROXYPROGESTERONE ACETATE 150 MG/ML IM SUSP
150.0000 mg | INTRAMUSCULAR | 3 refills | Status: DC
Start: 2022-11-08 — End: 2023-02-17

## 2022-11-18 ENCOUNTER — Ambulatory Visit (INDEPENDENT_AMBULATORY_CARE_PROVIDER_SITE_OTHER): Payer: Medicaid Other

## 2022-11-18 ENCOUNTER — Ambulatory Visit: Payer: Medicaid Other

## 2022-11-18 VITALS — BP 99/57 | HR 60 | Wt 130.0 lb

## 2022-11-18 DIAGNOSIS — Z3042 Encounter for surveillance of injectable contraceptive: Secondary | ICD-10-CM

## 2022-11-18 MED ORDER — MEDROXYPROGESTERONE ACETATE 150 MG/ML IM SUSP
150.0000 mg | Freq: Once | INTRAMUSCULAR | Status: AC
  Administered 2022-11-18: 150 mg via INTRAMUSCULAR

## 2022-11-18 NOTE — Progress Notes (Signed)
Mary Quinn here for Depo-Provera  Injection.  Injection administered without complication. Patient will return in 3 months for next injection.  Elvia Aydin l Taeya Theall, CMA 11/18/2022  8:28 AM

## 2023-02-17 ENCOUNTER — Ambulatory Visit (INDEPENDENT_AMBULATORY_CARE_PROVIDER_SITE_OTHER): Payer: Medicaid Other

## 2023-02-17 ENCOUNTER — Other Ambulatory Visit: Payer: Self-pay

## 2023-02-17 VITALS — BP 102/60 | HR 64 | Wt 126.0 lb

## 2023-02-17 DIAGNOSIS — Z3042 Encounter for surveillance of injectable contraceptive: Secondary | ICD-10-CM | POA: Diagnosis not present

## 2023-02-17 DIAGNOSIS — Z30013 Encounter for initial prescription of injectable contraceptive: Secondary | ICD-10-CM

## 2023-02-17 MED ORDER — MEDROXYPROGESTERONE ACETATE 150 MG/ML IM SUSP
150.0000 mg | Freq: Once | INTRAMUSCULAR | Status: AC
  Administered 2023-02-17: 150 mg via INTRAMUSCULAR

## 2023-02-17 MED ORDER — MEDROXYPROGESTERONE ACETATE 150 MG/ML IM SUSP: 150.0000 mg | INTRAMUSCULAR | 3 refills | Status: DC

## 2023-02-17 NOTE — Progress Notes (Signed)
Mary Quinn here for Depo-Provera  Injection.  Injection administered without complication. Patient will return in 3 months for next injection.  Braylyn Kalter l Valita Righter, CMA 02/17/2023  10:18 AM  [employer] language services interpreter [employer] [phone]

## 2023-05-13 ENCOUNTER — Ambulatory Visit (INDEPENDENT_AMBULATORY_CARE_PROVIDER_SITE_OTHER): Payer: Medicaid Other

## 2023-05-13 ENCOUNTER — Other Ambulatory Visit (HOSPITAL_BASED_OUTPATIENT_CLINIC_OR_DEPARTMENT_OTHER): Payer: Self-pay

## 2023-05-13 VITALS — BP 101/68 | HR 65 | Wt 123.0 lb

## 2023-05-13 DIAGNOSIS — Z3042 Encounter for surveillance of injectable contraceptive: Secondary | ICD-10-CM

## 2023-05-13 DIAGNOSIS — Z603 Acculturation difficulty: Secondary | ICD-10-CM

## 2023-05-13 MED ORDER — MEDROXYPROGESTERONE ACETATE 150 MG/ML IM SUSY
150.0000 mg | PREFILLED_SYRINGE | Freq: Once | INTRAMUSCULAR | Status: AC
  Administered 2023-05-13: 150 mg via INTRAMUSCULAR

## 2023-05-13 MED ORDER — MEDROXYPROGESTERONE ACETATE 150 MG/ML IM SUSP
150.0000 mg | INTRAMUSCULAR | 2 refills | Status: AC
  Filled 2023-05-13: qty 1, 90d supply, fill #0

## 2023-05-13 NOTE — Progress Notes (Signed)
Date last pap: 02/14/21 Last Depo-Provera: 02/17/23. Side Effects if any: none. Serum HCG indicated? NA. Depo-Provera 150 mg IM given by: Burnard Leigh RN. Next appointment due 3 months.    Armandina Stammer RN

## 2023-08-12 ENCOUNTER — Ambulatory Visit: Payer: Medicaid Other

## 2023-08-12 VITALS — BP 115/72 | HR 60 | Wt 123.0 lb

## 2023-08-12 DIAGNOSIS — Z3042 Encounter for surveillance of injectable contraceptive: Secondary | ICD-10-CM

## 2023-08-12 MED ORDER — MEDROXYPROGESTERONE ACETATE 150 MG/ML IM SUSP
150.0000 mg | Freq: Once | INTRAMUSCULAR | Status: AC
  Administered 2023-08-12: 150 mg via INTRAMUSCULAR

## 2023-08-12 NOTE — Progress Notes (Cosign Needed Addendum)
Date last pap: 02/14/2021 Last Depo-Provera: 05/13/2023 Side Effects if any: None Serum HCG indicated? N/A Depo-Provera 150 mg IM given by: L. Ernest Mallick, RN Next appointment due: Jan 7th - Jan 21st

## 2023-10-28 ENCOUNTER — Ambulatory Visit: Payer: Medicaid Other

## 2023-11-07 ENCOUNTER — Ambulatory Visit: Payer: Medicaid Other

## 2023-11-07 VITALS — BP 120/80 | Wt 123.0 lb

## 2023-11-07 DIAGNOSIS — Z3042 Encounter for surveillance of injectable contraceptive: Secondary | ICD-10-CM

## 2023-11-07 MED ORDER — MEDROXYPROGESTERONE ACETATE 150 MG/ML IM SUSY
150.0000 mg | PREFILLED_SYRINGE | Freq: Once | INTRAMUSCULAR | Status: AC
  Administered 2023-11-07: 150 mg via INTRAMUSCULAR

## 2023-11-07 NOTE — Progress Notes (Signed)
Date last pap: 02/14/2021 Last Depo-Provera: 08/12/2023 Side Effects if any: None Serum HCG indicated? N/A Depo-Provera 150 mg IM given by: L. Ernest Mallick, RN Next appointment due: April 4th - April 18th.

## 2024-01-27 ENCOUNTER — Other Ambulatory Visit: Payer: Self-pay | Admitting: Obstetrics and Gynecology

## 2024-01-27 DIAGNOSIS — Z30013 Encounter for initial prescription of injectable contraceptive: Secondary | ICD-10-CM

## 2024-01-28 ENCOUNTER — Ambulatory Visit: Payer: Medicaid Other

## 2024-01-28 VITALS — BP 112/86 | HR 64 | Wt 126.0 lb

## 2024-01-28 DIAGNOSIS — Z3202 Encounter for pregnancy test, result negative: Secondary | ICD-10-CM | POA: Diagnosis not present

## 2024-01-28 DIAGNOSIS — Z3042 Encounter for surveillance of injectable contraceptive: Secondary | ICD-10-CM | POA: Diagnosis not present

## 2024-01-28 LAB — POCT URINE PREGNANCY: Preg Test, Ur: NEGATIVE

## 2024-01-28 MED ORDER — MEDROXYPROGESTERONE ACETATE 150 MG/ML IM SUSY
150.0000 mg | PREFILLED_SYRINGE | Freq: Once | INTRAMUSCULAR | Status: AC
  Administered 2024-01-28: 150 mg via INTRAMUSCULAR

## 2024-01-28 NOTE — Addendum Note (Signed)
 Addended by: Billey Chang A on: 01/28/2024 10:06 AM   Modules accepted: Orders

## 2024-01-28 NOTE — Progress Notes (Signed)
 Date last pap: 02/14/2021. Last Depo-Provera: 11/07/2023. Side Effects if any: None. Serum HCG indicated? Neg. Depo-Provera 150 mg IM given by: [facility] RN. Next appointment due 04/14/2024-04/28/2024.

## 2024-02-25 ENCOUNTER — Ambulatory Visit: Admitting: Family Medicine

## 2024-04-14 ENCOUNTER — Ambulatory Visit

## 2024-04-14 VITALS — BP 106/67 | HR 70 | Wt 131.0 lb

## 2024-04-14 DIAGNOSIS — Z3042 Encounter for surveillance of injectable contraceptive: Secondary | ICD-10-CM | POA: Diagnosis not present

## 2024-04-14 MED ORDER — MEDROXYPROGESTERONE ACETATE 150 MG/ML IM SUSY
150.0000 mg | PREFILLED_SYRINGE | Freq: Once | INTRAMUSCULAR | Status: AC
  Administered 2024-04-14: 150 mg via INTRAMUSCULAR

## 2024-04-14 NOTE — Progress Notes (Signed)
 Date last pap: 02/14/2021. Last Depo-Provera : 01/28/2024. Side Effects if any: n/a. Serum HCG indicated? n/a. Depo-Provera  150 mg IM given by: York Blush, CMA. Next appointment due 06/30/2024.  Adilen Pavelko l Liller Yohn, CMA

## 2024-06-30 ENCOUNTER — Ambulatory Visit

## 2024-06-30 VITALS — BP 106/63 | HR 72 | Ht <= 58 in | Wt 129.0 lb

## 2024-06-30 DIAGNOSIS — Z3042 Encounter for surveillance of injectable contraceptive: Secondary | ICD-10-CM | POA: Diagnosis not present

## 2024-06-30 MED ORDER — MEDROXYPROGESTERONE ACETATE 150 MG/ML IM SUSP
150.0000 mg | Freq: Once | INTRAMUSCULAR | Status: AC
  Administered 2024-06-30: 150 mg via INTRAMUSCULAR

## 2024-06-30 NOTE — Progress Notes (Signed)
 Date last pap: 02/14/2021. Last Depo-Provera : 04/14/2024. Side Effects if any: none. Serum HCG indicated? na. Depo-Provera  150 mg IM given by: Shawnee Fleet, CMA . Next appointment due 09/15/2024-09/29/2024 annual needed with next depo.

## 2024-09-14 ENCOUNTER — Other Ambulatory Visit: Payer: Self-pay | Admitting: Obstetrics and Gynecology

## 2024-09-15 ENCOUNTER — Ambulatory Visit (INDEPENDENT_AMBULATORY_CARE_PROVIDER_SITE_OTHER): Admitting: Obstetrics and Gynecology

## 2024-09-15 ENCOUNTER — Other Ambulatory Visit (HOSPITAL_COMMUNITY)
Admission: RE | Admit: 2024-09-15 | Discharge: 2024-09-15 | Disposition: A | Source: Ambulatory Visit | Attending: Obstetrics and Gynecology | Admitting: Obstetrics and Gynecology

## 2024-09-15 VITALS — BP 108/63 | HR 74 | Ht <= 58 in | Wt 133.0 lb

## 2024-09-15 DIAGNOSIS — Q519 Congenital malformation of uterus and cervix, unspecified: Secondary | ICD-10-CM

## 2024-09-15 DIAGNOSIS — Z3042 Encounter for surveillance of injectable contraceptive: Secondary | ICD-10-CM

## 2024-09-15 DIAGNOSIS — Z01419 Encounter for gynecological examination (general) (routine) without abnormal findings: Secondary | ICD-10-CM | POA: Diagnosis not present

## 2024-09-15 DIAGNOSIS — Z124 Encounter for screening for malignant neoplasm of cervix: Secondary | ICD-10-CM

## 2024-09-15 DIAGNOSIS — Q51820 Cervical duplication: Secondary | ICD-10-CM | POA: Diagnosis present

## 2024-09-15 MED ORDER — MEDROXYPROGESTERONE ACETATE 150 MG/ML IM SUSP
150.0000 mg | Freq: Once | INTRAMUSCULAR | Status: AC
Start: 2024-09-15 — End: 2024-09-15
  Administered 2024-09-15: 150 mg via INTRAMUSCULAR

## 2024-09-15 NOTE — Progress Notes (Signed)
 ANNUAL GYNECOLOGY VISIT Chief Complaint  Patient presents with   Annual Exam     Subjective:  Mary Quinn is a 33 y.o. [phone] who presents for annual.  Video interpreter used for today's visit No concerns  Gyn History: No LMP recorded. Patient has had an injection. Sexually active: yes/no: Yes Contraception: Depo-Provera  History of STIs: No Last pap:  Lab Results  Component Value Date   DIAGPAP  02/14/2021    - Negative for intraepithelial lesion or malignancy (NILM)   HPVHIGH Negative 02/14/2021   History of abnormal pap: No Periods: no periods with depo   The pregnancy intention screening data noted above was reviewed. Potential methods of contraception were discussed. The patient elected to proceed with No data recorded.       02/14/2021    4:47 PM  Depression screen PHQ 2/9  Decreased Interest 0  Down, Depressed, Hopeless 0  PHQ - 2 Score 0  Altered sleeping 0  Tired, decreased energy 0  Change in appetite 0  Feeling bad or failure about yourself  0  Trouble concentrating 0  Moving slowly or fidgety/restless 0  Suicidal thoughts 0  PHQ-9 Score 0      Data saved with a previous flowsheet row definition        02/14/2021    4:48 PM  GAD 7 : Generalized Anxiety Score  Nervous, Anxious, on Edge 0  Control/stop worrying 0  Worry too much - different things 0  Trouble relaxing 0  Restless 0  Easily annoyed or irritable 0  Afraid - awful might happen 0  Total GAD 7 Score 0      OB History     Gravida  3   Para  3   Term  2   Preterm  1   AB      Living  3      SAB      IAB      Ectopic      Multiple  0   Live Births  3           No past medical history on file.  No past surgical history on file.  Social History   Socioeconomic History   Marital status: Married    Spouse name: Not on file   Number of children: Not on file   Years of education: Not on file   Highest education level: Not on file   Occupational History   Not on file  Tobacco Use   Smoking status: Never   Smokeless tobacco: Never  Vaping Use   Vaping status: Never Used  Substance and Sexual Activity   Alcohol use: Never   Drug use: Never   Sexual activity: Yes    Birth control/protection: None  Other Topics Concern   Not on file  Social History Narrative   Not on file   Social Drivers of Health   Financial Resource Strain: Not on file  Food Insecurity: Low Risk  (06/17/2024)   Received from [facility]   Hunger Vital Sign    Within the past 12 months, you worried that your food would run out before you got money to buy more: Never true    Within the past 12 months, the food you bought just didn't last and you didn't have money to get more. : Never true  Transportation Needs: No Transportation Needs (06/17/2024)   Received from [facility]    In the past 12 months,  has lack of reliable transportation kept you from medical appointments, meetings, work or from getting things needed for daily living? : No  Physical Activity: Not on file  Stress: Not on file  Social Connections: Not on file    Family History  Problem Relation Age of Onset   Diabetes Neg Hx     Current Outpatient Medications on File Prior to Visit  Medication Sig Dispense Refill   medroxyPROGESTERone  (DEPO-PROVERA ) 150 MG/ML injection Inject 1 mL (150 mg total) into the muscle every 3 (three) months. 1 mL 2   ferrous sulfate  325 (65 FE) MG EC tablet Take 1 tablet (325 mg total) by mouth every other day. (Patient not taking: Reported on 09/15/2024) 30 tablet 2   medroxyPROGESTERone  (DEPO-PROVERA ) 150 MG/ML injection Inject 1 mL (150 mg total) into the muscle every 3 (three) months. (Patient not taking: Reported on 09/15/2024) 1 mL 3   medroxyPROGESTERone  (DEPO-PROVERA ) 150 MG/ML injection INJECT 1 ML (150 MG TOTAL) INTO THE MUSCLE EVERY 3 (THREE) MONTHS (Patient not taking: Reported on 09/15/2024) 1 mL 2    medroxyPROGESTERone  Acetate 150 MG/ML SUSY INJECT 1 ML INTRAMUSCULARLY EVERY 3 MONTHS (Patient not taking: Reported on 09/15/2024) 1 mL 1   Prenatal Vit-Fe Fumarate-FA (PRENATAL MULTIVITAMIN) TABS tablet Take 1 tablet by mouth daily at 12 noon. (Patient not taking: Reported on 09/15/2024)     No current facility-administered medications on file prior to visit.    No Known Allergies   Objective:   Vitals:   09/15/24 1117  BP: 108/63  Pulse: 74  Weight: 133 lb 0.6 oz (60.3 kg)  Height: 4' 8 (1.422 m)   Physical Examination:   General appearance - well appearing, and in no distress  Mental status - alert, oriented to person, place, and time  Psych:  normal mood and affect  Skin - warm and dry, normal color, no suspicious lesions noted  Breasts - breasts appear normal, no suspicious masses, no skin or nipple changes or  axillary nodes  Abdomen - soft, nontender, nondistended, no masses or organomegaly  Pelvic -  VULVA: normal appearing vulva with no masses, tenderness or lesions   VAGINA: vaginal septum noted in upper 2/3 of the vagina  CERVIX: one cervix right of the vaginal septum, one possible ?small cervix noted on the left of the vaginal septum, normal in appearance  Thin prep pap is done with HR HPV cotesting on both sides  UTERUS: uterus is felt to be normal size, shape, consistency and nontender   ADNEXA: No adnexal masses or tenderness noted.  Extremities:  No swelling or varicosities noted  Chaperone present for exam  Assessment and Plan:  1. Well woman exam with routine gynecological exam (Primary) Pap x 2  Normal clinical breast exam, reviewed self breast exams, mammograms to start at age 67 Continue depo Declines STI testing  2. Cervical cancer screening - Cytology - PAP( [place]) - Cytology - PAP( [place])  3. 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 Patient states she was told she may have two cervixes. Prior pregnancy notes reviewed, septum noted at delivery but no note of two cervixes. Recommend pelvic ultrasound  - US  PELVIC COMPLETE WITH TRANSVAGINAL; Future   Rollo ONEIDA Bring, MD, FACOG Obstetrician & Gynecologist, [facility], [facility]

## 2024-09-15 NOTE — Progress Notes (Signed)
 [employer] language services interpreter [place] [phone].

## 2024-09-20 ENCOUNTER — Ambulatory Visit: Payer: Self-pay | Admitting: Obstetrics and Gynecology

## 2024-09-20 LAB — CYTOLOGY - PAP
Comment: NEGATIVE
Comment: NEGATIVE
Diagnosis: NEGATIVE
Diagnosis: NEGATIVE
High risk HPV: NEGATIVE
High risk HPV: NEGATIVE

## 2024-09-22 NOTE — Telephone Encounter (Signed)
 Per DPR, VM left with patient using interpreter that pap smears x2 were normal.  Erminio DELENA Rumps, RN

## 2024-09-22 NOTE — Telephone Encounter (Signed)
-----   Message from Rollo ONEIDA Bring sent at 09/20/2024 10:04 AM EST ----- Normal papx2, please inform pt ----- Message ----- From: Interface, Lab In [place] Sent: 09/20/2024   9:42 AM EST To: Rollo ONEIDA Bring, MD

## 2024-09-23 ENCOUNTER — Ambulatory Visit (HOSPITAL_BASED_OUTPATIENT_CLINIC_OR_DEPARTMENT_OTHER)

## 2024-10-05 ENCOUNTER — Ambulatory Visit (HOSPITAL_BASED_OUTPATIENT_CLINIC_OR_DEPARTMENT_OTHER)
Admission: RE | Admit: 2024-10-05 | Discharge: 2024-10-05 | Disposition: A | Discharge status: Home / Self Care | Source: Ambulatory Visit | Attending: Obstetrics and Gynecology | Admitting: Obstetrics and Gynecology

## 2024-10-05 ENCOUNTER — Other Ambulatory Visit: Payer: Self-pay | Admitting: Obstetrics and Gynecology

## 2024-10-05 DIAGNOSIS — Z01419 Encounter for gynecological examination (general) (routine) without abnormal findings: Secondary | ICD-10-CM

## 2024-10-05 DIAGNOSIS — Z124 Encounter for screening for malignant neoplasm of cervix: Secondary | ICD-10-CM

## 2024-10-05 DIAGNOSIS — Q519 Congenital malformation of uterus and cervix, unspecified: Secondary | ICD-10-CM

## 2024-10-05 DIAGNOSIS — Z3042 Encounter for surveillance of injectable contraceptive: Secondary | ICD-10-CM

## 2024-10-26 ENCOUNTER — Ambulatory Visit (HOSPITAL_BASED_OUTPATIENT_CLINIC_OR_DEPARTMENT_OTHER)
Admission: RE | Admit: 2024-10-26 | Discharge: 2024-10-26 | Disposition: A | Discharge status: Home / Self Care | Source: Ambulatory Visit | Attending: Obstetrics and Gynecology | Admitting: Obstetrics and Gynecology

## 2024-10-26 DIAGNOSIS — Q519 Congenital malformation of uterus and cervix, unspecified: Secondary | ICD-10-CM | POA: Diagnosis present

## 2024-11-11 ENCOUNTER — Ambulatory Visit: Payer: Self-pay | Admitting: Obstetrics and Gynecology

## 2024-11-11 DIAGNOSIS — Q519 Congenital malformation of uterus and cervix, unspecified: Secondary | ICD-10-CM

## 2024-11-16 NOTE — Telephone Encounter (Signed)
 Called patient using [place] interpreter.  Per DPR detailed message left for patient regarding US  results and the need for MRI.  Erminio DELENA Rumps, RN

## 2024-11-16 NOTE — Telephone Encounter (Signed)
-----   Message from Rollo Bring, MD sent at 11/11/2024  1:04 PM EST ----- Pelvic ultrasound is inconclusive and unable to diagnosis if she has two uteruses because transvaginal was not performed. I have ordered an MRI. Please review with her. Thank you!

## 2024-12-02 ENCOUNTER — Ambulatory Visit
